# Patient Record
Sex: Female | Born: 1945 | Race: White | Hispanic: No | Marital: Married | Smoking: Former smoker
Health system: Southern US, Community
[De-identification: ages and names within clinical notes are randomized; demographics above are authoritative.]

## PROBLEM LIST (undated history)

## (undated) DIAGNOSIS — R739 Hyperglycemia, unspecified: Secondary | ICD-10-CM

## (undated) DIAGNOSIS — J40 Bronchitis, not specified as acute or chronic: Secondary | ICD-10-CM

## (undated) HISTORY — DX: Hyperglycemia, unspecified: R73.9

## (undated) HISTORY — DX: Bronchitis, not specified as acute or chronic: J40

---

## 1980-03-30 HISTORY — PX: ABDOMINAL HYSTERECTOMY: SHX81

## 1990-03-30 HISTORY — PX: APPENDECTOMY: SHX54

## 1990-03-30 HISTORY — PX: BILATERAL OOPHORECTOMY: SHX1221

## 2004-07-23 ENCOUNTER — Ambulatory Visit: Payer: Self-pay | Admitting: Internal Medicine

## 2005-12-25 ENCOUNTER — Ambulatory Visit: Payer: Self-pay | Admitting: General Surgery

## 2005-12-31 ENCOUNTER — Emergency Department: Payer: Self-pay | Admitting: Emergency Medicine

## 2005-12-31 ENCOUNTER — Other Ambulatory Visit: Payer: Self-pay

## 2006-01-01 ENCOUNTER — Ambulatory Visit: Payer: Self-pay | Admitting: General Surgery

## 2006-06-15 LAB — HM COLONOSCOPY: HM Colonoscopy: NORMAL

## 2008-02-15 ENCOUNTER — Ambulatory Visit: Payer: Self-pay | Admitting: Internal Medicine

## 2008-02-22 ENCOUNTER — Ambulatory Visit: Payer: Self-pay | Admitting: Internal Medicine

## 2009-03-30 HISTORY — PX: BREAST BIOPSY: SHX20

## 2009-04-23 ENCOUNTER — Ambulatory Visit: Payer: Self-pay | Admitting: Internal Medicine

## 2010-03-27 ENCOUNTER — Inpatient Hospital Stay (HOSPITAL_COMMUNITY)
Admission: RE | Admit: 2010-03-27 | Discharge: 2010-03-28 | Payer: Self-pay | Source: Home / Self Care | Attending: Neurosurgery | Admitting: Neurosurgery

## 2010-03-27 HISTORY — PX: LUMBAR DISC SURGERY: SHX700

## 2010-06-09 LAB — BASIC METABOLIC PANEL
BUN: 14 mg/dL (ref 6–23)
CO2: 25 mEq/L (ref 19–32)
Calcium: 9.8 mg/dL (ref 8.4–10.5)
Chloride: 104 mEq/L (ref 96–112)
Creatinine, Ser: 0.68 mg/dL (ref 0.4–1.2)
GFR calc Af Amer: >60 mL/min (ref >60)
GFR calc non Af Amer: >60 mL/min (ref >60)
Glucose, Bld: 118 mg/dL — ABNORMAL HIGH (ref 70–99)
Potassium: 4.6 mEq/L (ref 3.5–5.1)
Sodium: 137 mEq/L (ref 135–145)

## 2010-06-09 LAB — CBC
HCT: 45.4 % (ref 36.0–46.0)
Hemoglobin: 15.3 g/dL — ABNORMAL HIGH (ref 12.0–15.0)
MCH: 27.9 pg (ref 26.0–34.0)
MCHC: 33.7 g/dL (ref 30.0–36.0)
MCV: 82.7 fL (ref 78.0–100.0)
Platelets: 211 10*3/uL (ref 150–400)
RBC: 5.49 MIL/uL — ABNORMAL HIGH (ref 3.87–5.11)
RDW: 14.1 % (ref 11.5–15.5)
WBC: 11.7 10*3/uL — ABNORMAL HIGH (ref 4.0–10.5)

## 2010-06-09 LAB — ABO/RH: ABO/RH(D): O POS

## 2010-06-09 LAB — TYPE AND SCREEN
ABO/RH(D): O POS
Antibody Screen: NEGATIVE

## 2010-06-09 LAB — SURGICAL PCR SCREEN
MRSA, PCR: NEGATIVE
Staphylococcus aureus: NEGATIVE

## 2010-12-31 ENCOUNTER — Ambulatory Visit: Payer: Self-pay | Admitting: Unknown Physician Specialty

## 2011-04-27 ENCOUNTER — Ambulatory Visit: Payer: Self-pay | Admitting: Internal Medicine

## 2011-05-01 ENCOUNTER — Ambulatory Visit: Payer: Self-pay | Admitting: Internal Medicine

## 2011-05-11 ENCOUNTER — Ambulatory Visit: Payer: Self-pay | Admitting: Internal Medicine

## 2011-06-05 ENCOUNTER — Ambulatory Visit: Payer: Self-pay | Admitting: Internal Medicine

## 2011-06-15 ENCOUNTER — Encounter: Payer: Self-pay | Admitting: Internal Medicine

## 2011-06-15 ENCOUNTER — Ambulatory Visit (INDEPENDENT_AMBULATORY_CARE_PROVIDER_SITE_OTHER): Payer: Medicare Other | Admitting: Internal Medicine

## 2011-06-15 VITALS — BP 130/70 | HR 85 | Temp 98.2°F | Ht 61.5 in | Wt 135.0 lb

## 2011-06-15 DIAGNOSIS — R7303 Prediabetes: Secondary | ICD-10-CM | POA: Insufficient documentation

## 2011-06-15 DIAGNOSIS — F411 Generalized anxiety disorder: Secondary | ICD-10-CM

## 2011-06-15 DIAGNOSIS — Z1239 Encounter for other screening for malignant neoplasm of breast: Secondary | ICD-10-CM

## 2011-06-15 DIAGNOSIS — R7309 Other abnormal glucose: Secondary | ICD-10-CM

## 2011-06-15 DIAGNOSIS — J411 Mucopurulent chronic bronchitis: Secondary | ICD-10-CM

## 2011-06-15 DIAGNOSIS — F419 Anxiety disorder, unspecified: Secondary | ICD-10-CM | POA: Insufficient documentation

## 2011-06-15 NOTE — Assessment & Plan Note (Signed)
Currently asymptomatic. Will obtain records on previous evaluation and management. We'll also obtain records on recent chest x-ray. Followup in one month.

## 2011-06-15 NOTE — Assessment & Plan Note (Signed)
Encouraged patient to eat female with blood sugar results over the next few weeks. Will obtain records on previous evaluation and management. Followup in one month.

## 2011-06-15 NOTE — Assessment & Plan Note (Signed)
Symptoms currently well-controlled with Xanax. We'll plan to continue.

## 2011-06-15 NOTE — Progress Notes (Signed)
Subjective:    Patient ID: Regina Kennedy, female    DOB: 1945/11/12, 66 y.o.   MRN: [id number]  HPI 66 year old female with history of anxiety, elevated blood sugars/borderline diabetes, and recurrent bronchitis presents to establish care. In regards to her anxiety, she reports her symptoms are well controlled with the use of Xanax. She does not generally take 3 tablets daily, but mostly uses this medication at night to help with anxiety and insomnia.  In regards to her elevated blood sugars, she brings records from her previous physician which shows her last A1c was 6.5%. She was [facility] and has started nutritional counseling. She has made a conscious effort to improve her diet. She has been checking her blood sugar approximately once per day but did not bring record with her today. She is not taking medications to lower her blood sugar at this time.  In regards to her history of bronchitis, she notes that she is a former smoker and quit smoking in May of 2011. She has had a couple of episodes over the last 3 years of bronchitis which responded to use of prednisone and antibiotics. She does not use any maintenance medication or inhalers for chronic bronchitis or asthma. She denies any current symptoms of shortness of breath or cough.  Outpatient Encounter Prescriptions as of 06/15/2011  Medication Sig Dispense Refill  . ALPRAZolam (XANAX) 0.5 MG tablet Take 0.5 mg by mouth 3 (three) times daily as needed.      . Calcium Carb-Cholecalciferol (LIQUID CALCIUM WITH D3) [id number] [id number] MG-UNIT CAPS Take by mouth.      . cholecalciferol (VITAMIN D) 1000 UNITS tablet Take 1,000 Units by mouth daily.      . vitamin C (ASCORBIC ACID) 500 MG tablet Take 500 mg by mouth daily.        Review of Systems  Constitutional: Negative for fever, chills, appetite change, fatigue and unexpected weight change.  HENT: Negative for ear pain, congestion, sore throat, trouble swallowing, neck pain,  voice change and sinus pressure.   Eyes: Negative for visual disturbance.  Respiratory: Negative for cough, shortness of breath, wheezing and stridor.   Cardiovascular: Negative for chest pain, palpitations and leg swelling.  Gastrointestinal: Negative for nausea, vomiting, abdominal pain, diarrhea, constipation, blood in stool, abdominal distention and anal bleeding.  Genitourinary: Negative for dysuria and flank pain.  Musculoskeletal: Negative for myalgias, arthralgias and gait problem.  Skin: Negative for color change and rash.  Neurological: Negative for dizziness and headaches.  Hematological: Negative for adenopathy. Does not bruise/bleed easily.  Psychiatric/Behavioral: Negative for suicidal ideas, sleep disturbance and dysphoric mood. The patient is not nervous/anxious.    BP 130/70  Pulse 85  Temp(Src) 98.2 F (36.8 C) (Oral)  Ht 5' 1.5" (1.562 m)  Wt 135 lb (61.236 kg)  BMI 25.10 kg/m2  SpO2 97%     Objective:   Physical Exam  Constitutional: She is oriented to person, place, and time. She appears well-developed and well-nourished. No distress.  HENT:  Head: Normocephalic and atraumatic.  Right Ear: External ear normal.  Left Ear: External ear normal.  Nose: Nose normal.  Mouth/Throat: Oropharynx is clear and moist. No oropharyngeal exudate.  Eyes: Conjunctivae are normal. Pupils are equal, round, and reactive to light. Right eye exhibits no discharge. Left eye exhibits no discharge. No scleral icterus.  Neck: Normal range of motion. Neck supple. No tracheal deviation present. No thyromegaly present.  Cardiovascular: Normal rate, regular rhythm, normal heart sounds and intact  distal pulses.  Exam reveals no gallop and no friction rub.   No murmur heard. Pulmonary/Chest: Effort normal and breath sounds normal. No respiratory distress. She has no wheezes. She has no rales. She exhibits no tenderness.  Abdominal: Soft. Bowel sounds are normal. She exhibits no distension  and no mass. There is no tenderness. There is no rebound and no guarding.  Musculoskeletal: Normal range of motion. She exhibits no edema and no tenderness.  Lymphadenopathy:    She has no cervical adenopathy.  Neurological: She is alert and oriented to person, place, and time. No cranial nerve deficit. She exhibits normal muscle tone. Coordination normal.  Skin: Skin is warm and dry. No rash noted. She is not diaphoretic. No erythema. No pallor.  Psychiatric: She has a normal mood and affect. Her behavior is normal. Judgment and thought content normal.          Assessment & Plan:

## 2011-06-22 ENCOUNTER — Ambulatory Visit: Payer: Self-pay | Admitting: Internal Medicine

## 2011-06-29 ENCOUNTER — Ambulatory Visit: Payer: Self-pay | Admitting: Internal Medicine

## 2011-07-20 ENCOUNTER — Other Ambulatory Visit (HOSPITAL_COMMUNITY)
Admission: RE | Admit: 2011-07-20 | Discharge: 2011-07-20 | Disposition: A | Discharge status: Home / Self Care | Payer: Medicare Other | Source: Ambulatory Visit | Attending: Internal Medicine | Admitting: Internal Medicine

## 2011-07-20 ENCOUNTER — Ambulatory Visit (INDEPENDENT_AMBULATORY_CARE_PROVIDER_SITE_OTHER): Payer: Medicare Other | Admitting: Internal Medicine

## 2011-07-20 ENCOUNTER — Encounter: Payer: Self-pay | Admitting: Internal Medicine

## 2011-07-20 VITALS — BP 150/74 | HR 74 | Temp 97.8°F | Ht 62.0 in | Wt 132.0 lb

## 2011-07-20 DIAGNOSIS — R7303 Prediabetes: Secondary | ICD-10-CM

## 2011-07-20 DIAGNOSIS — E785 Hyperlipidemia, unspecified: Secondary | ICD-10-CM

## 2011-07-20 DIAGNOSIS — F419 Anxiety disorder, unspecified: Secondary | ICD-10-CM

## 2011-07-20 DIAGNOSIS — Z124 Encounter for screening for malignant neoplasm of cervix: Secondary | ICD-10-CM

## 2011-07-20 DIAGNOSIS — R7309 Other abnormal glucose: Secondary | ICD-10-CM

## 2011-07-20 DIAGNOSIS — Z1159 Encounter for screening for other viral diseases: Secondary | ICD-10-CM | POA: Insufficient documentation

## 2011-07-20 DIAGNOSIS — F411 Generalized anxiety disorder: Secondary | ICD-10-CM

## 2011-07-20 LAB — COMPREHENSIVE METABOLIC PANEL
AST: 19 U/L (ref 0–37)
Albumin: 4.6 g/dL (ref 3.5–5.2)
Alkaline Phosphatase: 93 U/L (ref 39–117)
BUN: 18 mg/dL (ref 6–23)
Creatinine, Ser: 0.8 mg/dL (ref 0.4–1.2)
Potassium: 4.3 mEq/L (ref 3.5–5.1)
Sodium: 138 mEq/L (ref 135–145)
Total Bilirubin: 0.6 mg/dL (ref 0.3–1.2)
Total Protein: 7.8 g/dL (ref 6.0–8.3)

## 2011-07-20 LAB — LIPID PANEL: Total CHOL/HDL Ratio: 10

## 2011-07-20 MED ORDER — ALPRAZOLAM 0.5 MG PO TABS: 0.5000 mg | ORAL_TABLET | Freq: Three times a day (TID) | ORAL | Status: DC | PRN

## 2011-07-20 MED ORDER — GLUCOSE BLOOD VI STRP: ORAL_STRIP | Status: AC

## 2011-07-20 NOTE — Assessment & Plan Note (Signed)
Exam is normal today including pelvic and breast exam. Pap is pending. Will request records on patient's vaccination record. Will check labs today including CMP and A1c.

## 2011-07-20 NOTE — Progress Notes (Signed)
Subjective:    Patient ID: Regina Kennedy, female    DOB: 05-26-1945, 66 y.o.   MRN: [id number]  HPI 66 year old female with history of borderline diabetes mellitus presents for her annual physical exam. She reports that she has been doing well. Review of recent blood sugars show motion occurs between 100-130. She has been following a healthy diet which is low in process sugar and saturated fat. She has lost another 4 pounds. She notes a mild increase in bloating with increase fiber in her diet. She has taken Beano for this with no improvement. She has also tried to increase her physical activity in an effort to control her blood sugar.  Outpatient Encounter Prescriptions as of 07/20/2011  Medication Sig Dispense Refill  . ALPRAZolam (XANAX) 0.5 MG tablet Take 1 tablet (0.5 mg total) by mouth 3 (three) times daily as needed.  90 tablet  3  . Calcium Carb-Cholecalciferol (LIQUID CALCIUM WITH D3) 5641430727 MG-UNIT CAPS Take by mouth.      . cholecalciferol (VITAMIN D) 1000 UNITS tablet Take 1,000 Units by mouth daily.      . vitamin C (ASCORBIC ACID) 500 MG tablet Take 500 mg by mouth daily.      Marland Kitchen DISCONTD: ALPRAZolam (XANAX) 0.5 MG tablet Take 0.5 mg by mouth 3 (three) times daily as needed.      Marland Kitchen glucose blood test strip Use as instructed  100 each  12    Review of Systems  Constitutional: Negative for fever, chills, appetite change, fatigue and unexpected weight change.  HENT: Negative for ear pain, congestion, sore throat, trouble swallowing, neck pain, voice change and sinus pressure.   Eyes: Negative for visual disturbance.  Respiratory: Negative for cough, shortness of breath, wheezing and stridor.   Cardiovascular: Negative for chest pain, palpitations and leg swelling.  Gastrointestinal: Positive for abdominal distention. Negative for nausea, vomiting, abdominal pain, diarrhea, constipation, blood in stool and anal bleeding.  Genitourinary: Negative for dysuria and flank pain.    Musculoskeletal: Negative for myalgias, arthralgias and gait problem.  Skin: Negative for color change and rash.  Neurological: Negative for dizziness and headaches.  Hematological: Negative for adenopathy. Does not bruise/bleed easily.  Psychiatric/Behavioral: Negative for suicidal ideas, sleep disturbance and dysphoric mood. The patient is not nervous/anxious.    BP 150/74  Pulse 74  Temp(Src) 97.8 F (36.6 C) (Oral)  Ht 5\' 2"  (1.575 m)  Wt 132 lb (59.875 kg)  BMI 24.14 kg/m2     Objective:   Physical Exam  Constitutional: She is oriented to person, place, and time. She appears well-developed and well-nourished. No distress.  HENT:  Head: Normocephalic and atraumatic.  Right Ear: External ear normal.  Left Ear: External ear normal.  Nose: Nose normal.  Mouth/Throat: Oropharynx is clear and moist. No oropharyngeal exudate.  Eyes: Conjunctivae are normal. Pupils are equal, round, and reactive to light. Right eye exhibits no discharge. Left eye exhibits no discharge. No scleral icterus.  Neck: Normal range of motion. Neck supple. No tracheal deviation present. No thyromegaly present.  Cardiovascular: Normal rate, regular rhythm, normal heart sounds and intact distal pulses.  Exam reveals no gallop and no friction rub.   No murmur heard. Pulmonary/Chest: Effort normal and breath sounds normal. No respiratory distress. She has no wheezes. She has no rales. She exhibits no tenderness.  Abdominal: Soft. Bowel sounds are normal. She exhibits no distension and no mass. There is no tenderness. There is no rebound and no guarding.  Genitourinary: Rectum normal,  vagina normal and uterus normal.    No breast swelling, tenderness, discharge or bleeding. Pelvic exam was performed with patient supine. There is no rash, tenderness or lesion on the right labia. There is no rash, tenderness or lesion on the left labia. Uterus is not enlarged and not tender. Cervix exhibits no motion tenderness, no  discharge and no friability. Right adnexum displays no mass, no tenderness and no fullness. Left adnexum displays no mass, no tenderness and no fullness. No erythema or tenderness around the vagina. No vaginal discharge found.  Musculoskeletal: Normal range of motion. She exhibits no edema and no tenderness.  Lymphadenopathy:    She has no cervical adenopathy.  Neurological: She is alert and oriented to person, place, and time. No cranial nerve deficit. She exhibits normal muscle tone. Coordination normal.  Skin: Skin is warm and dry. No rash noted. She is not diaphoretic. No erythema. No pallor.  Psychiatric: She has a normal mood and affect. Her behavior is normal. Judgment and thought content normal.          Assessment & Plan:

## 2011-07-20 NOTE — Assessment & Plan Note (Signed)
Will check A1c with labs today. Follow up 6 months.

## 2011-07-28 ENCOUNTER — Telehealth: Payer: Self-pay | Admitting: Internal Medicine

## 2011-07-28 NOTE — Telephone Encounter (Signed)
Caller: Regina Kennedy/Patient; PCP: Ronna Polio; CB#: [phone];  Call regarding Questions Related To Cholesterol Medications; Allergies; First she states her recent cholesterol was elevated and PCP wanted her to go on Crestor.  She has not tolerated similar medications previously.   Would like to know if medication is needed could she get Rx for Atorvastatin instead.  Her most recent cholesterol  test was not fasting; she had eaten egg and other foods for breakfast. Second issue:   Allergy sx worse on 07/27/11.  She has previously taken Allegra D; it kept her from resting well.  Advised home care and parameters for callback per Allergies protocol. Zyrtec and Claritin recommended.

## 2011-07-28 NOTE — Telephone Encounter (Signed)
If she is worried about accuracy of the cholesterol test because she had eaten, we can repeat fasting lipid profile. Then, based on results we can decide about medication.

## 2011-07-29 ENCOUNTER — Ambulatory Visit: Payer: Self-pay | Admitting: Internal Medicine

## 2011-07-30 ENCOUNTER — Telehealth: Payer: Self-pay | Admitting: Internal Medicine

## 2011-07-30 NOTE — Telephone Encounter (Signed)
Patient calling, states she was returning a call from a [employer].  States she thinks it was in regards to a question about her cholesterol medication.  Called office and they state that no one by that name works there.  Patient states she is not positive that it was a call from the office, but she thought it might be.  Please call patient back if you need to speak with her.  Thank you.

## 2011-07-30 NOTE — Telephone Encounter (Signed)
Left detailed message notifying patient. Asked that she call back to schedule lab appt if she would like to repeat labs.

## 2011-07-30 NOTE — Telephone Encounter (Signed)
I would recommend reading the book "Prevent and Reverse Heart Disease" by Dr. Suzzette Righter. This book has good information and recipes to help lower cholesterol.

## 2011-07-30 NOTE — Telephone Encounter (Signed)
Patient has Crestor samples but does not want to try any meds in the statin family because of past side effects and would like to know what else you could suggest for cholesterol. Please advise.

## 2011-07-30 NOTE — Telephone Encounter (Signed)
See attached message

## 2011-07-31 NOTE — Telephone Encounter (Signed)
Left message notifying patient.

## 2011-10-07 ENCOUNTER — Ambulatory Visit: Payer: Self-pay | Admitting: General Surgery

## 2011-11-06 ENCOUNTER — Telehealth: Payer: Self-pay | Admitting: Internal Medicine

## 2011-11-06 NOTE — Telephone Encounter (Signed)
Caller: Regina Kennedy/Patient; PCP: Ronna Polio; CB#: [phone]; ; ; Call regarding Sinus Drainage, Coughed Up Some Yellow Bloody Mucus; Onset 11/05/11 Patient states she is coughing up Mucus streaked with blood. patient states she is on Cefdinir for URI. Afebrile.  All emergent symptoms ruled out per Upper Respiratory Infection with exception "Productive cough with colored sputum."  Attempted to schedule appointment in [facility].  Appointment not available.  Message sent to the office.  Please follow up with patient for appointment needs.  Patient states she can be reached at home number or her cell phone [phone] and ok to leave a message.

## 2011-11-06 NOTE — Telephone Encounter (Signed)
Will need to go to urgent care or ED for evaluation. Should go today as she will need CXR given blood in sputum

## 2011-11-06 NOTE — Telephone Encounter (Signed)
Patient advised as instructed via telephone, she will go to Urgent Care.

## 2011-11-26 ENCOUNTER — Ambulatory Visit: Payer: Self-pay | Admitting: Unknown Physician Specialty

## 2011-11-27 ENCOUNTER — Ambulatory Visit: Payer: Self-pay | Admitting: Hematology and Oncology

## 2011-12-01 ENCOUNTER — Ambulatory Visit: Payer: Self-pay | Admitting: Hematology and Oncology

## 2011-12-01 LAB — COMPREHENSIVE METABOLIC PANEL
Albumin: 3.5 g/dL (ref 3.4–5.0)
Anion Gap: 9 (ref 7–16)
Calcium, Total: 9.7 mg/dL (ref 8.5–10.1)
Chloride: 101 mmol/L (ref 98–107)
EGFR (African American): >60
Glucose: 153 mg/dL — ABNORMAL HIGH (ref 65–99)
Osmolality: 276 (ref 275–301)
Potassium: 4.2 mmol/L (ref 3.5–5.1)
SGOT(AST): 13 U/L — ABNORMAL LOW (ref 15–37)
Sodium: 135 mmol/L — ABNORMAL LOW (ref 136–145)
Total Protein: 8 g/dL (ref 6.4–8.2)

## 2011-12-01 LAB — CBC CANCER CENTER
Basophil #: 0 x10 3/mm (ref 0.0–0.1)
Basophil %: 0.2 %
Eosinophil %: 0 %
HCT: 43.2 % (ref 35.0–47.0)
Lymphocyte #: 0.7 x10 3/mm — ABNORMAL LOW (ref 1.0–3.6)
MCH: 24.1 pg — ABNORMAL LOW (ref 26.0–34.0)
MCV: 78 fL — ABNORMAL LOW (ref 80–100)
Monocyte %: 3.1 %
Neutrophil #: 12.5 x10 3/mm — ABNORMAL HIGH (ref 1.4–6.5)
RBC: 5.58 10*6/uL — ABNORMAL HIGH (ref 3.80–5.20)
WBC: 13.7 x10 3/mm — ABNORMAL HIGH (ref 3.6–11.0)

## 2011-12-01 LAB — PROTIME-INR: INR: 1

## 2011-12-01 LAB — LACTATE DEHYDROGENASE: LDH: 283 U/L — ABNORMAL HIGH (ref 81–234)

## 2011-12-04 ENCOUNTER — Ambulatory Visit: Payer: Self-pay | Admitting: Unknown Physician Specialty

## 2011-12-07 ENCOUNTER — Ambulatory Visit: Payer: Self-pay | Admitting: Hematology and Oncology

## 2011-12-10 LAB — MAGNESIUM: Magnesium: 2.3 mg/dL

## 2011-12-17 LAB — CBC CANCER CENTER
Basophil #: 0 x10 3/mm (ref 0.0–0.1)
Eosinophil #: 0.1 x10 3/mm (ref 0.0–0.7)
Eosinophil %: 0.6 %
HCT: 37 % (ref 35.0–47.0)
Lymphocyte #: 0.9 x10 3/mm — ABNORMAL LOW (ref 1.0–3.6)
Lymphocyte %: 7.7 %
MCV: 77 fL — ABNORMAL LOW (ref 80–100)
Monocyte %: 9.7 %
Neutrophil #: 9.6 x10 3/mm — ABNORMAL HIGH (ref 1.4–6.5)
Platelet: 270 x10 3/mm (ref 150–440)
RBC: 4.79 10*6/uL (ref 3.80–5.20)
RDW: 18 % — ABNORMAL HIGH (ref 11.5–14.5)
WBC: 11.7 x10 3/mm — ABNORMAL HIGH (ref 3.6–11.0)

## 2011-12-17 LAB — COMPREHENSIVE METABOLIC PANEL
Calcium, Total: 9.2 mg/dL (ref 8.5–10.1)
Chloride: 93 mmol/L — ABNORMAL LOW (ref 98–107)
EGFR (Non-African Amer.): >60
Glucose: 189 mg/dL — ABNORMAL HIGH (ref 65–99)
Potassium: 4.5 mmol/L (ref 3.5–5.1)
SGOT(AST): 21 U/L (ref 15–37)
SGPT (ALT): 23 U/L (ref 12–78)

## 2011-12-17 LAB — PATHOLOGY REPORT

## 2011-12-24 LAB — CBC CANCER CENTER
Basophil #: 0 x10 3/mm (ref 0.0–0.1)
Eosinophil #: 0.1 x10 3/mm (ref 0.0–0.7)
HCT: 35.5 % (ref 35.0–47.0)
Lymphocyte %: 28.4 %
MCHC: 32.1 g/dL (ref 32.0–36.0)
Neutrophil #: 2.6 x10 3/mm (ref 1.4–6.5)
Neutrophil %: 64 %
Platelet: 275 x10 3/mm (ref 150–440)
RDW: 17.6 % — ABNORMAL HIGH (ref 11.5–14.5)

## 2011-12-28 LAB — URINALYSIS, COMPLETE
Leukocyte Esterase: NEGATIVE
Ph: 6 (ref 4.5–8.0)
Protein: NEGATIVE
RBC,UR: 1 /HPF (ref 0–5)

## 2011-12-29 ENCOUNTER — Ambulatory Visit: Payer: Self-pay | Admitting: Hematology and Oncology

## 2011-12-29 LAB — URINE CULTURE

## 2011-12-31 LAB — CBC CANCER CENTER
Basophil #: 0.1 x10 3/mm (ref 0.0–0.1)
HCT: 36.2 % (ref 35.0–47.0)
HGB: 11.5 g/dL — ABNORMAL LOW (ref 12.0–16.0)
Lymphocyte #: 1 x10 3/mm (ref 1.0–3.6)
MCH: 24.3 pg — ABNORMAL LOW (ref 26.0–34.0)
MCHC: 31.6 g/dL — ABNORMAL LOW (ref 32.0–36.0)
MCV: 77 fL — ABNORMAL LOW (ref 80–100)
Monocyte #: 1.5 x10 3/mm — ABNORMAL HIGH (ref 0.2–0.9)
Monocyte %: 41.8 %
Neutrophil #: 0.9 x10 3/mm — ABNORMAL LOW (ref 1.4–6.5)
RDW: 18.1 % — ABNORMAL HIGH (ref 11.5–14.5)

## 2012-01-05 ENCOUNTER — Ambulatory Visit: Payer: Self-pay | Admitting: General Surgery

## 2012-01-07 LAB — CBC CANCER CENTER
Basophil #: 0.1 x10 3/mm (ref 0.0–0.1)
Eosinophil #: 0.1 x10 3/mm (ref 0.0–0.7)
HGB: 10.6 g/dL — ABNORMAL LOW (ref 12.0–16.0)
Lymphocyte #: 1.4 x10 3/mm (ref 1.0–3.6)
MCH: 24 pg — ABNORMAL LOW (ref 26.0–34.0)
MCV: 76 fL — ABNORMAL LOW (ref 80–100)
Monocyte #: 1.1 x10 3/mm — ABNORMAL HIGH (ref 0.2–0.9)
Neutrophil %: 74.8 %
Platelet: 329 x10 3/mm (ref 150–440)
RDW: 18.1 % — ABNORMAL HIGH (ref 11.5–14.5)
WBC: 11 x10 3/mm (ref 3.6–11.0)

## 2012-01-07 LAB — COMPREHENSIVE METABOLIC PANEL
Albumin: 2.6 g/dL — ABNORMAL LOW (ref 3.4–5.0)
Anion Gap: 9 (ref 7–16)
Calcium, Total: 9.4 mg/dL (ref 8.5–10.1)
EGFR (African American): >60
Glucose: 184 mg/dL — ABNORMAL HIGH (ref 65–99)
Potassium: 4.5 mmol/L (ref 3.5–5.1)
SGOT(AST): 14 U/L — ABNORMAL LOW (ref 15–37)

## 2012-01-14 LAB — CBC CANCER CENTER
Basophil #: 0.1 x10 3/mm (ref 0.0–0.1)
Basophil %: 0.9 %
HCT: 35.1 % (ref 35.0–47.0)
HGB: 10.8 g/dL — ABNORMAL LOW (ref 12.0–16.0)
Lymphocyte %: 16.5 %
Monocyte %: 3.8 %
Neutrophil #: 6.6 x10 3/mm — ABNORMAL HIGH (ref 1.4–6.5)
Neutrophil %: 76.8 %
WBC: 8.6 x10 3/mm (ref 3.6–11.0)

## 2012-01-18 ENCOUNTER — Other Ambulatory Visit: Payer: Self-pay | Admitting: Internal Medicine

## 2012-01-19 ENCOUNTER — Ambulatory Visit: Payer: Medicare Other | Admitting: Internal Medicine

## 2012-01-19 NOTE — Telephone Encounter (Signed)
Rx called to [facility] pharmacy.

## 2012-01-21 LAB — CBC CANCER CENTER
Basophil #: 0.1 x10 3/mm (ref 0.0–0.1)
Basophil %: 1.9 %
HCT: 33.6 % — ABNORMAL LOW (ref 35.0–47.0)
Lymphocyte #: 1.1 x10 3/mm (ref 1.0–3.6)
MCH: 24 pg — ABNORMAL LOW (ref 26.0–34.0)
MCHC: 31.4 g/dL — ABNORMAL LOW (ref 32.0–36.0)
MCV: 77 fL — ABNORMAL LOW (ref 80–100)
Monocyte %: 25.5 %
Neutrophil #: 2.4 x10 3/mm (ref 1.4–6.5)
RDW: 18.8 % — ABNORMAL HIGH (ref 11.5–14.5)

## 2012-01-28 LAB — COMPREHENSIVE METABOLIC PANEL
Albumin: 2.8 g/dL — ABNORMAL LOW (ref 3.4–5.0)
Alkaline Phosphatase: 154 U/L — ABNORMAL HIGH (ref 50–136)
Bilirubin,Total: 0.4 mg/dL (ref 0.2–1.0)
Creatinine: 0.81 mg/dL (ref 0.60–1.30)
EGFR (African American): >60
Osmolality: 277 (ref 275–301)
Potassium: 4.3 mmol/L (ref 3.5–5.1)
SGOT(AST): 12 U/L — ABNORMAL LOW (ref 15–37)
Sodium: 134 mmol/L — ABNORMAL LOW (ref 136–145)

## 2012-01-28 LAB — CBC CANCER CENTER
Basophil %: 1.1 %
Eosinophil #: 0.1 x10 3/mm (ref 0.0–0.7)
Eosinophil %: 1.3 %
HCT: 33.1 % — ABNORMAL LOW (ref 35.0–47.0)
HGB: 10.2 g/dL — ABNORMAL LOW (ref 12.0–16.0)
Lymphocyte #: 1.2 x10 3/mm (ref 1.0–3.6)
MCH: 23.8 pg — ABNORMAL LOW (ref 26.0–34.0)
MCV: 77 fL — ABNORMAL LOW (ref 80–100)
Monocyte #: 1.4 x10 3/mm — ABNORMAL HIGH (ref 0.2–0.9)
Neutrophil #: 7.9 x10 3/mm — ABNORMAL HIGH (ref 1.4–6.5)
RBC: 4.29 10*6/uL (ref 3.80–5.20)
RDW: 18.9 % — ABNORMAL HIGH (ref 11.5–14.5)

## 2012-01-28 LAB — MAGNESIUM: Magnesium: 2 mg/dL

## 2012-01-29 ENCOUNTER — Ambulatory Visit: Payer: Self-pay | Admitting: Hematology and Oncology

## 2012-02-04 LAB — CBC CANCER CENTER
Basophil #: 0 x10 3/mm (ref 0.0–0.1)
Basophil %: 0.8 %
Eosinophil #: 0.2 x10 3/mm (ref 0.0–0.7)
Eosinophil %: 2.7 %
HGB: 10.4 g/dL — ABNORMAL LOW (ref 12.0–16.0)
Lymphocyte #: 1.2 x10 3/mm (ref 1.0–3.6)
MCH: 23.9 pg — ABNORMAL LOW (ref 26.0–34.0)
MCV: 76 fL — ABNORMAL LOW (ref 80–100)
Monocyte #: 0.2 x10 3/mm (ref 0.2–0.9)
Neutrophil #: 4.5 x10 3/mm (ref 1.4–6.5)
Platelet: 239 x10 3/mm (ref 150–440)
RBC: 4.35 10*6/uL (ref 3.80–5.20)
RDW: 18.9 % — ABNORMAL HIGH (ref 11.5–14.5)

## 2012-02-11 LAB — CBC CANCER CENTER
Basophil #: 0 x10 3/mm (ref 0.0–0.1)
Basophil %: 1.5 %
HCT: 31.4 % — ABNORMAL LOW (ref 35.0–47.0)
HGB: 9.8 g/dL — ABNORMAL LOW (ref 12.0–16.0)
Lymphocyte %: 28.3 %
Monocyte #: 1 x10 3/mm — ABNORMAL HIGH (ref 0.2–0.9)
Monocyte %: 32.2 %
Neutrophil #: 1.1 x10 3/mm — ABNORMAL LOW (ref 1.4–6.5)
Neutrophil %: 36.1 %
WBC: 3.1 x10 3/mm — ABNORMAL LOW (ref 3.6–11.0)

## 2012-02-18 LAB — CBC CANCER CENTER
Basophil %: 0.7 %
Eosinophil #: 0.1 x10 3/mm (ref 0.0–0.7)
Eosinophil %: 0.7 %
HCT: 30.8 % — ABNORMAL LOW (ref 35.0–47.0)
HGB: 9.6 g/dL — ABNORMAL LOW (ref 12.0–16.0)
Lymphocyte #: 0.9 x10 3/mm — ABNORMAL LOW (ref 1.0–3.6)
MCH: 23.8 pg — ABNORMAL LOW (ref 26.0–34.0)
MCV: 77 fL — ABNORMAL LOW (ref 80–100)
Monocyte #: 1.3 x10 3/mm — ABNORMAL HIGH (ref 0.2–0.9)
Monocyte %: 14 %
Neutrophil #: 7.1 x10 3/mm — ABNORMAL HIGH (ref 1.4–6.5)
Platelet: 296 x10 3/mm (ref 150–440)
RBC: 4.01 10*6/uL (ref 3.80–5.20)
RDW: 20 % — ABNORMAL HIGH (ref 11.5–14.5)
WBC: 9.4 x10 3/mm (ref 3.6–11.0)

## 2012-02-18 LAB — COMPREHENSIVE METABOLIC PANEL
Alkaline Phosphatase: 144 U/L — ABNORMAL HIGH (ref 50–136)
BUN: 10 mg/dL (ref 7–18)
Bilirubin,Total: 0.4 mg/dL (ref 0.2–1.0)
Chloride: 94 mmol/L — ABNORMAL LOW (ref 98–107)
Co2: 29 mmol/L (ref 21–32)
Creatinine: 0.76 mg/dL (ref 0.60–1.30)
EGFR (African American): >60
Potassium: 4.2 mmol/L (ref 3.5–5.1)
SGPT (ALT): 17 U/L (ref 12–78)
Total Protein: 7 g/dL (ref 6.4–8.2)

## 2012-02-24 LAB — CBC CANCER CENTER
Basophil %: 0.6 %
Eosinophil #: 0.1 x10 3/mm (ref 0.0–0.7)
Eosinophil %: 1.4 %
HCT: 32.9 % — ABNORMAL LOW (ref 35.0–47.0)
HGB: 10.4 g/dL — ABNORMAL LOW (ref 12.0–16.0)
MCH: 23.8 pg — ABNORMAL LOW (ref 26.0–34.0)
MCHC: 31.6 g/dL — ABNORMAL LOW (ref 32.0–36.0)
MCV: 75 fL — ABNORMAL LOW (ref 80–100)
Monocyte #: 0.1 x10 3/mm — ABNORMAL LOW (ref 0.2–0.9)
Neutrophil #: 5.5 x10 3/mm (ref 1.4–6.5)
Neutrophil %: 81 %
RBC: 4.37 10*6/uL (ref 3.80–5.20)
WBC: 6.8 x10 3/mm (ref 3.6–11.0)

## 2012-02-28 ENCOUNTER — Ambulatory Visit: Payer: Self-pay | Admitting: Hematology and Oncology

## 2012-03-03 LAB — COMPREHENSIVE METABOLIC PANEL
Bilirubin,Total: 0.5 mg/dL (ref 0.2–1.0)
Chloride: 97 mmol/L — ABNORMAL LOW (ref 98–107)
Co2: 29 mmol/L (ref 21–32)
Creatinine: 0.9 mg/dL (ref 0.60–1.30)
EGFR (African American): >60
Osmolality: 276 (ref 275–301)
Potassium: 4.2 mmol/L (ref 3.5–5.1)
SGPT (ALT): 20 U/L (ref 12–78)
Sodium: 136 mmol/L (ref 136–145)
Total Protein: 7 g/dL (ref 6.4–8.2)

## 2012-03-03 LAB — LACTATE DEHYDROGENASE: LDH: 376 U/L — ABNORMAL HIGH (ref 81–246)

## 2012-03-03 LAB — CBC CANCER CENTER
Basophil %: 2.4 %
Eosinophil #: 0 x10 3/mm (ref 0.0–0.7)
Eosinophil %: 2 %
HGB: 10.7 g/dL — ABNORMAL LOW (ref 12.0–16.0)
Lymphocyte #: 0.7 x10 3/mm — ABNORMAL LOW (ref 1.0–3.6)
Lymphocyte %: 26.2 %
MCHC: 32 g/dL (ref 32.0–36.0)
MCV: 76 fL — ABNORMAL LOW (ref 80–100)
Monocyte %: 34.2 %
Neutrophil #: 0.9 x10 3/mm — ABNORMAL LOW (ref 1.4–6.5)
RBC: 4.39 10*6/uL (ref 3.80–5.20)
RDW: 20.7 % — ABNORMAL HIGH (ref 11.5–14.5)
WBC: 2.5 x10 3/mm — ABNORMAL LOW (ref 3.6–11.0)

## 2012-03-08 LAB — CBC CANCER CENTER
Eosinophil #: 0 x10 3/mm (ref 0.0–0.7)
Eosinophil %: 0.4 %
HCT: 30.5 % — ABNORMAL LOW (ref 35.0–47.0)
Lymphocyte #: 1 x10 3/mm (ref 1.0–3.6)
Lymphocyte %: 10.6 %
MCH: 24.2 pg — ABNORMAL LOW (ref 26.0–34.0)
MCHC: 32 g/dL (ref 32.0–36.0)
MCV: 76 fL — ABNORMAL LOW (ref 80–100)
Monocyte #: 1.5 x10 3/mm — ABNORMAL HIGH (ref 0.2–0.9)
Monocyte %: 15.5 %
Neutrophil #: 7 x10 3/mm — ABNORMAL HIGH (ref 1.4–6.5)
Neutrophil %: 73 %
Platelet: 291 x10 3/mm (ref 150–440)
RDW: 20.9 % — ABNORMAL HIGH (ref 11.5–14.5)

## 2012-03-08 LAB — COMPREHENSIVE METABOLIC PANEL
Albumin: 2.8 g/dL — ABNORMAL LOW (ref 3.4–5.0)
BUN: 9 mg/dL (ref 7–18)
Bilirubin,Total: 0.5 mg/dL (ref 0.2–1.0)
Calcium, Total: 9.3 mg/dL (ref 8.5–10.1)
Chloride: 97 mmol/L — ABNORMAL LOW (ref 98–107)
Creatinine: 0.74 mg/dL (ref 0.60–1.30)
EGFR (African American): >60
Glucose: 165 mg/dL — ABNORMAL HIGH (ref 65–99)
SGOT(AST): 14 U/L — ABNORMAL LOW (ref 15–37)
SGPT (ALT): 17 U/L (ref 12–78)
Sodium: 135 mmol/L — ABNORMAL LOW (ref 136–145)
Total Protein: 7.2 g/dL (ref 6.4–8.2)

## 2012-03-30 ENCOUNTER — Ambulatory Visit: Payer: Self-pay | Admitting: Hematology and Oncology

## 2012-04-06 LAB — CBC CANCER CENTER
Lymphocyte #: 0.7 x10 3/mm — ABNORMAL LOW (ref 1.0–3.6)
Lymphocyte %: 5.4 %
MCH: 23.8 pg — ABNORMAL LOW (ref 26.0–34.0)
MCHC: 31.4 g/dL — ABNORMAL LOW (ref 32.0–36.0)
Neutrophil #: 10.9 x10 3/mm — ABNORMAL HIGH (ref 1.4–6.5)
Neutrophil %: 81.4 %
WBC: 13.3 x10 3/mm — ABNORMAL HIGH (ref 3.6–11.0)

## 2012-04-06 LAB — COMPREHENSIVE METABOLIC PANEL
Albumin: 2.6 g/dL — ABNORMAL LOW (ref 3.4–5.0)
Anion Gap: 11 (ref 7–16)
BUN: 13 mg/dL (ref 7–18)
Calcium, Total: 9 mg/dL (ref 8.5–10.1)
Chloride: 94 mmol/L — ABNORMAL LOW (ref 98–107)
EGFR (Non-African Amer.): >60
Total Protein: 7.2 g/dL (ref 6.4–8.2)

## 2012-04-19 LAB — CBC CANCER CENTER
Basophil #: 0 x10 3/mm (ref 0.0–0.1)
Eosinophil #: 0 x10 3/mm (ref 0.0–0.7)
Eosinophil %: 1.3 %
HCT: 28.6 % — ABNORMAL LOW (ref 35.0–47.0)
HGB: 9 g/dL — ABNORMAL LOW (ref 12.0–16.0)
Lymphocyte %: 26.2 %
MCH: 24 pg — ABNORMAL LOW (ref 26.0–34.0)
MCHC: 31.6 g/dL — ABNORMAL LOW (ref 32.0–36.0)
MCV: 76 fL — ABNORMAL LOW (ref 80–100)
Monocyte #: 0.4 x10 3/mm (ref 0.2–0.9)
Monocyte %: 16.2 %
Neutrophil #: 1.5 x10 3/mm (ref 1.4–6.5)
Neutrophil %: 55.1 %
Platelet: 238 x10 3/mm (ref 150–440)
RBC: 3.75 10*6/uL — ABNORMAL LOW (ref 3.80–5.20)
RDW: 22.7 % — ABNORMAL HIGH (ref 11.5–14.5)
WBC: 2.6 x10 3/mm — ABNORMAL LOW (ref 3.6–11.0)

## 2012-04-20 LAB — CBC CANCER CENTER
Basophil %: 1.4 %
Eosinophil %: 1.1 %
HGB: 9.1 g/dL — ABNORMAL LOW (ref 12.0–16.0)
Lymphocyte %: 21.6 %
MCH: 24.2 pg — ABNORMAL LOW (ref 26.0–34.0)
MCHC: 31.6 g/dL — ABNORMAL LOW (ref 32.0–36.0)
MCV: 77 fL — ABNORMAL LOW (ref 80–100)
Monocyte %: 17.2 %
Neutrophil #: 1.2 x10 3/mm — ABNORMAL LOW (ref 1.4–6.5)
Neutrophil %: 58.7 %
Platelet: 260 x10 3/mm (ref 150–440)
RBC: 3.77 10*6/uL — ABNORMAL LOW (ref 3.80–5.20)
RDW: 23 % — ABNORMAL HIGH (ref 11.5–14.5)
WBC: 2 x10 3/mm — CL (ref 3.6–11.0)

## 2012-04-27 LAB — CBC CANCER CENTER
Basophil #: 0.1 x10 3/mm (ref 0.0–0.1)
Basophil %: 1 %
Eosinophil %: 0.6 %
HCT: 29 % — ABNORMAL LOW (ref 35.0–47.0)
Lymphocyte #: 0.7 x10 3/mm — ABNORMAL LOW (ref 1.0–3.6)
Lymphocyte %: 13.3 %
MCH: 24 pg — ABNORMAL LOW (ref 26.0–34.0)
MCHC: 31.2 g/dL — ABNORMAL LOW (ref 32.0–36.0)
MCV: 77 fL — ABNORMAL LOW (ref 80–100)
Monocyte #: 1.7 x10 3/mm — ABNORMAL HIGH (ref 0.2–0.9)
Monocyte %: 30.6 %
Platelet: 346 x10 3/mm (ref 150–440)
RBC: 3.77 10*6/uL — ABNORMAL LOW (ref 3.80–5.20)
RDW: 22.7 % — ABNORMAL HIGH (ref 11.5–14.5)
WBC: 5.4 x10 3/mm (ref 3.6–11.0)

## 2012-04-30 ENCOUNTER — Ambulatory Visit: Payer: Self-pay | Admitting: Hematology and Oncology

## 2012-05-03 ENCOUNTER — Ambulatory Visit: Payer: Medicare Other | Admitting: Internal Medicine

## 2012-05-04 ENCOUNTER — Ambulatory Visit: Payer: Self-pay | Admitting: Hematology and Oncology

## 2012-05-04 LAB — COMPREHENSIVE METABOLIC PANEL
Albumin: 2.9 g/dL — ABNORMAL LOW (ref 3.4–5.0)
Alkaline Phosphatase: 171 U/L — ABNORMAL HIGH (ref 50–136)
Bilirubin,Total: 0.3 mg/dL (ref 0.2–1.0)
Calcium, Total: 9 mg/dL (ref 8.5–10.1)
Chloride: 98 mmol/L (ref 98–107)
Co2: 26 mmol/L (ref 21–32)
Creatinine: 0.68 mg/dL (ref 0.60–1.30)
EGFR (African American): >60
Glucose: 118 mg/dL — ABNORMAL HIGH (ref 65–99)
Osmolality: 267 (ref 275–301)
Potassium: 4.3 mmol/L (ref 3.5–5.1)
SGOT(AST): 20 U/L (ref 15–37)
Total Protein: 7.2 g/dL (ref 6.4–8.2)

## 2012-05-04 LAB — CBC CANCER CENTER
Basophil #: 0.1 x10 3/mm (ref 0.0–0.1)
Basophil %: 0.6 %
Eosinophil #: 0 x10 3/mm (ref 0.0–0.7)
HCT: 29.6 % — ABNORMAL LOW (ref 35.0–47.0)
Lymphocyte #: 0.6 x10 3/mm — ABNORMAL LOW (ref 1.0–3.6)
MCH: 24 pg — ABNORMAL LOW (ref 26.0–34.0)
MCHC: 31.3 g/dL — ABNORMAL LOW (ref 32.0–36.0)
MCV: 77 fL — ABNORMAL LOW (ref 80–100)
Platelet: 290 x10 3/mm (ref 150–440)
RBC: 3.86 10*6/uL (ref 3.80–5.20)
RDW: 23.7 % — ABNORMAL HIGH (ref 11.5–14.5)

## 2012-05-14 ENCOUNTER — Other Ambulatory Visit: Payer: Self-pay

## 2012-05-22 LAB — COMPREHENSIVE METABOLIC PANEL
Anion Gap: 9 (ref 7–16)
BUN: 18 mg/dL (ref 7–18)
Bilirubin,Total: 0.4 mg/dL (ref 0.2–1.0)
Co2: 25 mmol/L (ref 21–32)
Creatinine: 0.81 mg/dL (ref 0.60–1.30)
EGFR (African American): >60
EGFR (Non-African Amer.): >60
Glucose: 117 mg/dL — ABNORMAL HIGH (ref 65–99)
Osmolality: 267 (ref 275–301)
SGPT (ALT): 11 U/L — ABNORMAL LOW (ref 12–78)
Sodium: 132 mmol/L — ABNORMAL LOW (ref 136–145)

## 2012-05-22 LAB — CBC WITH DIFFERENTIAL/PLATELET
Comment - H1-Com3: NORMAL
Eosinophil %: 0.2 %
HCT: 25.9 % — ABNORMAL LOW (ref 35.0–47.0)
Lymphocyte #: 0.6 10*3/uL — ABNORMAL LOW (ref 1.0–3.6)
Lymphocyte %: 4.4 %
MCH: 23.9 pg — ABNORMAL LOW (ref 26.0–34.0)
Monocyte #: 1.1 x10 3/mm — ABNORMAL HIGH (ref 0.2–0.9)
Monocyte %: 8.1 %
Monocytes: 5 %
Neutrophil %: 87 %
Platelet: 264 10*3/uL (ref 150–440)
RDW: 24 % — ABNORMAL HIGH (ref 11.5–14.5)

## 2012-05-22 LAB — CK TOTAL AND CKMB (NOT AT ARMC)
CK, Total: 25 U/L (ref 21–215)
CK-MB: <0.5 ng/mL — ABNORMAL LOW (ref 0.5–3.6)

## 2012-05-23 ENCOUNTER — Inpatient Hospital Stay: Payer: Self-pay | Admitting: Internal Medicine

## 2012-05-23 LAB — URINALYSIS, COMPLETE
Bacteria: NONE SEEN
Bilirubin,UR: NEGATIVE
Blood: NEGATIVE
Glucose,UR: NEGATIVE mg/dL (ref 0–75)
Nitrite: NEGATIVE
Ph: 5 (ref 4.5–8.0)
Protein: NEGATIVE
RBC,UR: 1 /HPF (ref 0–5)
Specific Gravity: 1.047 (ref 1.003–1.030)
Squamous Epithelial: 2

## 2012-05-24 LAB — BASIC METABOLIC PANEL
Anion Gap: 6 — ABNORMAL LOW (ref 7–16)
BUN: 9 mg/dL (ref 7–18)
Calcium, Total: 7.2 mg/dL — ABNORMAL LOW (ref 8.5–10.1)
Chloride: 106 mmol/L (ref 98–107)
Co2: 24 mmol/L (ref 21–32)
EGFR (African American): >60
Glucose: 85 mg/dL (ref 65–99)
Osmolality: 270 (ref 275–301)
Potassium: 3.9 mmol/L (ref 3.5–5.1)
Sodium: 136 mmol/L (ref 136–145)

## 2012-05-24 LAB — CBC WITH DIFFERENTIAL/PLATELET
Basophil %: 0.5 %
Eosinophil #: 0 10*3/uL (ref 0.0–0.7)
HCT: 21.8 % — ABNORMAL LOW (ref 35.0–47.0)
Lymphocyte #: 0.3 10*3/uL — ABNORMAL LOW (ref 1.0–3.6)
Lymphocyte %: 3.3 %
MCH: 25.3 pg — ABNORMAL LOW (ref 26.0–34.0)
MCHC: 32 g/dL (ref 32.0–36.0)
MCV: 79 fL — ABNORMAL LOW (ref 80–100)
Monocyte %: 11 %
Neutrophil %: 85.1 %
WBC: 10.4 10*3/uL (ref 3.6–11.0)

## 2012-05-24 LAB — URINE CULTURE

## 2012-05-28 ENCOUNTER — Ambulatory Visit: Payer: Self-pay | Admitting: Hematology and Oncology

## 2012-05-28 LAB — CULTURE, BLOOD (SINGLE)

## 2012-06-01 LAB — COMPREHENSIVE METABOLIC PANEL
Albumin: 2.4 g/dL — ABNORMAL LOW (ref 3.4–5.0)
Bilirubin,Total: 0.3 mg/dL (ref 0.2–1.0)
Chloride: 96 mmol/L — ABNORMAL LOW (ref 98–107)
Co2: 31 mmol/L (ref 21–32)
EGFR (African American): >60
EGFR (Non-African Amer.): >60
Osmolality: 272 (ref 275–301)
Potassium: 4 mmol/L (ref 3.5–5.1)
SGOT(AST): 16 U/L (ref 15–37)
SGPT (ALT): 11 U/L — ABNORMAL LOW (ref 12–78)
Sodium: 134 mmol/L — ABNORMAL LOW (ref 136–145)
Total Protein: 6.4 g/dL (ref 6.4–8.2)

## 2012-06-01 LAB — CBC CANCER CENTER
Basophil %: 0.5 %
Eosinophil %: 0.2 %
Lymphocyte #: 0.3 x10 3/mm — ABNORMAL LOW (ref 1.0–3.6)
Lymphocyte %: 2.7 %
MCH: 24.4 pg — ABNORMAL LOW (ref 26.0–34.0)
MCHC: 31.1 g/dL — ABNORMAL LOW (ref 32.0–36.0)
Neutrophil #: 10.7 x10 3/mm — ABNORMAL HIGH (ref 1.4–6.5)
Neutrophil %: 83.3 %
Platelet: 291 x10 3/mm (ref 150–440)
RBC: 3.12 10*6/uL — ABNORMAL LOW (ref 3.80–5.20)
RDW: 24.2 % — ABNORMAL HIGH (ref 11.5–14.5)
WBC: 12.8 x10 3/mm — ABNORMAL HIGH (ref 3.6–11.0)

## 2012-06-01 LAB — MAGNESIUM: Magnesium: 1.8 mg/dL

## 2012-06-01 LAB — LACTATE DEHYDROGENASE: LDH: 338 U/L — ABNORMAL HIGH (ref 81–246)

## 2012-06-08 LAB — CBC CANCER CENTER
Basophil #: 0.1 x10 3/mm (ref 0.0–0.1)
Basophil %: 0.7 %
Eosinophil #: 0.2 x10 3/mm (ref 0.0–0.7)
Lymphocyte #: 0.5 x10 3/mm — ABNORMAL LOW (ref 1.0–3.6)
Lymphocyte %: 3.3 %
MCHC: 31.3 g/dL — ABNORMAL LOW (ref 32.0–36.0)
MCV: 83 fL (ref 80–100)
Monocyte #: 1.1 x10 3/mm — ABNORMAL HIGH (ref 0.2–0.9)
Monocyte %: 7.8 %
Neutrophil #: 12.4 x10 3/mm — ABNORMAL HIGH (ref 1.4–6.5)
Neutrophil %: 87.1 %
RDW: 22.5 % — ABNORMAL HIGH (ref 11.5–14.5)
WBC: 14.3 x10 3/mm — ABNORMAL HIGH (ref 3.6–11.0)

## 2012-06-08 LAB — COMPREHENSIVE METABOLIC PANEL
Albumin: 2.9 g/dL — ABNORMAL LOW (ref 3.4–5.0)
Anion Gap: 8 (ref 7–16)
BUN: 16 mg/dL (ref 7–18)
Bilirubin,Total: 0.6 mg/dL (ref 0.2–1.0)
Chloride: 99 mmol/L (ref 98–107)
Co2: 31 mmol/L (ref 21–32)
Creatinine: 0.99 mg/dL (ref 0.60–1.30)
EGFR (Non-African Amer.): 59 — ABNORMAL LOW
Glucose: 178 mg/dL — ABNORMAL HIGH (ref 65–99)
Osmolality: 281 (ref 275–301)
Potassium: 3.9 mmol/L (ref 3.5–5.1)
SGPT (ALT): 12 U/L (ref 12–78)
Total Protein: 6.9 g/dL (ref 6.4–8.2)

## 2012-06-09 ENCOUNTER — Telehealth: Payer: Self-pay | Admitting: Internal Medicine

## 2012-06-09 NOTE — Telephone Encounter (Signed)
Informing us that that they called to schedule PT eval for today and pt stated she was going to be out and wanted to wait until next Tuesday.

## 2012-06-09 NOTE — Telephone Encounter (Signed)
FYI, please read below.

## 2012-06-15 LAB — CBC CANCER CENTER
Basophil %: 0.9 %
Eosinophil %: 0.9 %
HGB: 9.8 g/dL — ABNORMAL LOW (ref 12.0–16.0)
Lymphocyte %: 13.7 %
MCV: 82 fL (ref 80–100)
Monocyte #: 0.4 x10 3/mm (ref 0.2–0.9)
Monocyte %: 11.2 %
Neutrophil %: 73.3 %
Platelet: 204 x10 3/mm (ref 150–440)

## 2012-06-22 LAB — CBC CANCER CENTER
Basophil #: 0.1 x10 3/mm (ref 0.0–0.1)
Basophil %: 1.3 %
Eosinophil #: 0 x10 3/mm (ref 0.0–0.7)
Eosinophil %: 0.3 %
HCT: 29.2 % — ABNORMAL LOW (ref 35.0–47.0)
HGB: 9.5 g/dL — ABNORMAL LOW (ref 12.0–16.0)
Lymphocyte #: 0.5 x10 3/mm — ABNORMAL LOW (ref 1.0–3.6)
Lymphocyte %: 12.6 %
MCH: 26.5 pg (ref 26.0–34.0)
MCV: 81 fL (ref 80–100)
Monocyte #: 0.8 x10 3/mm (ref 0.2–0.9)
Monocyte %: 20.9 %
Neutrophil #: 2.6 x10 3/mm (ref 1.4–6.5)
Neutrophil %: 64.9 %
Platelet: 224 x10 3/mm (ref 150–440)
RBC: 3.6 10*6/uL — ABNORMAL LOW (ref 3.80–5.20)
RDW: 20 % — ABNORMAL HIGH (ref 11.5–14.5)

## 2012-06-24 ENCOUNTER — Telehealth: Payer: Self-pay | Admitting: *Deleted

## 2012-06-24 NOTE — Telephone Encounter (Signed)
Irving Burton from physical therapy left a message stating patient cancelled her last 2 appointments. She cancelled Tuesday because she was not feeling up to it and cancelled it today because she was having company come over.

## 2012-06-27 NOTE — Telephone Encounter (Signed)
Please review other encounter in reference to this.

## 2012-06-28 ENCOUNTER — Ambulatory Visit: Payer: Self-pay | Admitting: Hematology and Oncology

## 2012-06-29 LAB — CBC CANCER CENTER
Basophil #: 0.1 x10 3/mm (ref 0.0–0.1)
Basophil %: 0.8 %
Eosinophil #: 0 x10 3/mm (ref 0.0–0.7)
Eosinophil %: 0.4 %
HCT: 29.5 % — ABNORMAL LOW (ref 35.0–47.0)
HGB: 9.3 g/dL — ABNORMAL LOW (ref 12.0–16.0)
Lymphocyte #: 0.8 x10 3/mm — ABNORMAL LOW (ref 1.0–3.6)
Lymphocyte %: 7.6 %
MCHC: 31.6 g/dL — ABNORMAL LOW (ref 32.0–36.0)
Monocyte #: 1.1 x10 3/mm — ABNORMAL HIGH (ref 0.2–0.9)
Monocyte %: 10.7 %
Neutrophil #: 8.4 x10 3/mm — ABNORMAL HIGH (ref 1.4–6.5)
Neutrophil %: 80.5 %
RBC: 3.65 10*6/uL — ABNORMAL LOW (ref 3.80–5.20)
RDW: 20 % — ABNORMAL HIGH (ref 11.5–14.5)
WBC: 10.5 x10 3/mm (ref 3.6–11.0)

## 2012-06-29 LAB — COMPREHENSIVE METABOLIC PANEL
Alkaline Phosphatase: 122 U/L (ref 50–136)
BUN: 13 mg/dL (ref 7–18)
Bilirubin,Total: 0.2 mg/dL (ref 0.2–1.0)
Calcium, Total: 8.2 mg/dL — ABNORMAL LOW (ref 8.5–10.1)
Chloride: 97 mmol/L — ABNORMAL LOW (ref 98–107)
Co2: 25 mmol/L (ref 21–32)
Creatinine: 0.92 mg/dL (ref 0.60–1.30)
EGFR (African American): >60
EGFR (Non-African Amer.): >60
Glucose: 112 mg/dL — ABNORMAL HIGH (ref 65–99)
Potassium: 4 mmol/L (ref 3.5–5.1)
SGOT(AST): 17 U/L (ref 15–37)
SGPT (ALT): 12 U/L (ref 12–78)
Sodium: 133 mmol/L — ABNORMAL LOW (ref 136–145)

## 2012-07-06 LAB — CBC CANCER CENTER
Basophil #: 0 x10 3/mm (ref 0.0–0.1)
Basophil %: 0.6 %
HCT: 29.3 % — ABNORMAL LOW (ref 35.0–47.0)
Lymphocyte #: 0.3 x10 3/mm — ABNORMAL LOW (ref 1.0–3.6)
Lymphocyte %: 6.1 %
MCV: 80 fL (ref 80–100)
Monocyte #: 0.2 x10 3/mm (ref 0.2–0.9)
Monocyte %: 4.5 %
Neutrophil #: 4.2 x10 3/mm (ref 1.4–6.5)
Neutrophil %: 88.7 %
Platelet: 200 x10 3/mm (ref 150–440)
RDW: 19.8 % — ABNORMAL HIGH (ref 11.5–14.5)
WBC: 4.7 x10 3/mm (ref 3.6–11.0)

## 2012-07-13 LAB — CBC CANCER CENTER
Basophil #: 0 x10 3/mm (ref 0.0–0.1)
Basophil %: 0.4 %
HCT: 28.2 % — ABNORMAL LOW (ref 35.0–47.0)
HGB: 9 g/dL — ABNORMAL LOW (ref 12.0–16.0)
Lymphocyte #: 0.4 x10 3/mm — ABNORMAL LOW (ref 1.0–3.6)
MCH: 25.6 pg — ABNORMAL LOW (ref 26.0–34.0)
MCHC: 32.1 g/dL (ref 32.0–36.0)
MCV: 80 fL (ref 80–100)
Monocyte %: 14.7 %
Neutrophil #: 4.6 x10 3/mm (ref 1.4–6.5)
Platelet: 239 x10 3/mm (ref 150–440)
RBC: 3.53 10*6/uL — ABNORMAL LOW (ref 3.80–5.20)
WBC: 5.9 x10 3/mm (ref 3.6–11.0)

## 2012-07-20 LAB — CBC CANCER CENTER
Basophil #: 0 x10 3/mm (ref 0.0–0.1)
Basophil %: 0.5 %
HCT: 25.3 % — ABNORMAL LOW (ref 35.0–47.0)
HGB: 8.1 g/dL — ABNORMAL LOW (ref 12.0–16.0)
Lymphocyte #: 0.6 x10 3/mm — ABNORMAL LOW (ref 1.0–3.6)
Lymphocyte %: 8.5 %
MCH: 25.6 pg — ABNORMAL LOW (ref 26.0–34.0)
MCHC: 32 g/dL (ref 32.0–36.0)
MCV: 80 fL (ref 80–100)
Monocyte #: 0.8 x10 3/mm (ref 0.2–0.9)
Monocyte %: 11.1 %
Neutrophil %: 79.8 %
WBC: 6.8 x10 3/mm (ref 3.6–11.0)

## 2012-07-20 LAB — COMPREHENSIVE METABOLIC PANEL
Albumin: 2.6 g/dL — ABNORMAL LOW (ref 3.4–5.0)
BUN: 15 mg/dL (ref 7–18)
Bilirubin,Total: 0.3 mg/dL (ref 0.2–1.0)
Calcium, Total: 8.7 mg/dL (ref 8.5–10.1)
Chloride: 99 mmol/L (ref 98–107)
Creatinine: 0.9 mg/dL (ref 0.60–1.30)
EGFR (African American): >60
Potassium: 4.1 mmol/L (ref 3.5–5.1)

## 2012-07-28 ENCOUNTER — Ambulatory Visit: Payer: Self-pay | Admitting: Hematology and Oncology

## 2012-08-04 LAB — COMPREHENSIVE METABOLIC PANEL
Albumin: 2.9 g/dL — ABNORMAL LOW (ref 3.4–5.0)
Alkaline Phosphatase: 91 U/L (ref 50–136)
BUN: 17 mg/dL (ref 7–18)
Calcium, Total: 8.3 mg/dL — ABNORMAL LOW (ref 8.5–10.1)
EGFR (Non-African Amer.): 53 — ABNORMAL LOW
Osmolality: 273 (ref 275–301)
Potassium: 4.1 mmol/L (ref 3.5–5.1)
SGOT(AST): 19 U/L (ref 15–37)
Sodium: 134 mmol/L — ABNORMAL LOW (ref 136–145)
Total Protein: 6.7 g/dL (ref 6.4–8.2)

## 2012-08-04 LAB — CBC CANCER CENTER
Basophil #: 0 x10 3/mm (ref 0.0–0.1)
Basophil %: 1.1 %
Eosinophil %: 0.1 %
HCT: 25.9 % — ABNORMAL LOW (ref 35.0–47.0)
HGB: 8.4 g/dL — ABNORMAL LOW (ref 12.0–16.0)
Lymphocyte #: 0.3 x10 3/mm — ABNORMAL LOW (ref 1.0–3.6)
Lymphocyte %: 13.5 %
MCHC: 32.6 g/dL (ref 32.0–36.0)
MCV: 80 fL (ref 80–100)
Monocyte #: 0.4 x10 3/mm (ref 0.2–0.9)
Monocyte %: 18.8 %
Neutrophil #: 1.5 x10 3/mm (ref 1.4–6.5)
RBC: 3.24 10*6/uL — ABNORMAL LOW (ref 3.80–5.20)

## 2012-08-17 LAB — COMPREHENSIVE METABOLIC PANEL
Albumin: 2.6 g/dL — ABNORMAL LOW (ref 3.4–5.0)
Alkaline Phosphatase: 105 U/L (ref 50–136)
BUN: 18 mg/dL (ref 7–18)
Bilirubin,Total: 0.1 mg/dL — ABNORMAL LOW (ref 0.2–1.0)
Chloride: 99 mmol/L (ref 98–107)
Co2: 23 mmol/L (ref 21–32)
Creatinine: 0.89 mg/dL (ref 0.60–1.30)
EGFR (Non-African Amer.): >60
Glucose: 182 mg/dL — ABNORMAL HIGH (ref 65–99)
Osmolality: 275 (ref 275–301)
Potassium: 4.2 mmol/L (ref 3.5–5.1)
SGOT(AST): 19 U/L (ref 15–37)
Sodium: 134 mmol/L — ABNORMAL LOW (ref 136–145)

## 2012-08-17 LAB — CBC CANCER CENTER
Basophil #: 0 x10 3/mm (ref 0.0–0.1)
Eosinophil %: 0.4 %
HGB: 8.2 g/dL — ABNORMAL LOW (ref 12.0–16.0)
MCH: 27.3 pg (ref 26.0–34.0)
MCHC: 33 g/dL (ref 32.0–36.0)
Monocyte %: 11.3 %
Neutrophil #: 5.3 x10 3/mm (ref 1.4–6.5)
Neutrophil %: 78.1 %
WBC: 6.8 x10 3/mm (ref 3.6–11.0)

## 2012-08-18 LAB — CANCER CENTER HEMOGLOBIN: HGB: 7.3 g/dL — ABNORMAL LOW (ref 12.0–16.0)

## 2012-08-24 LAB — CBC CANCER CENTER
Basophil #: 0 "x10 3/mm "
Basophil %: 0.2 %
Eosinophil #: 0 "x10 3/mm "
Eosinophil %: 0.2 %
HCT: 36.3 %
HGB: 12.1 g/dL
Lymphocyte %: 6.7 %
Lymphs Abs: 0.7 "x10 3/mm " — ABNORMAL LOW
MCH: 28.1 pg
MCHC: 33.2 g/dL
MCV: 85 fL
Monocyte #: 0.8 "x10 3/mm "
Monocyte %: 8.1 %
Neutrophil #: 8.4 "x10 3/mm " — ABNORMAL HIGH
Neutrophil %: 84.8 %
Platelet: 221 "x10 3/mm "
RBC: 4.3 "x10 6/mm "
RDW: 18.5 % — ABNORMAL HIGH
WBC: 9.9 "x10 3/mm "

## 2012-08-27 ENCOUNTER — Observation Stay: Payer: Self-pay | Admitting: Internal Medicine

## 2012-08-27 LAB — CBC WITH DIFFERENTIAL/PLATELET
Basophil #: 0 10*3/uL (ref 0.0–0.1)
Basophil %: 0.2 %
HCT: 32.9 % — ABNORMAL LOW (ref 35.0–47.0)
HGB: 11 g/dL — ABNORMAL LOW (ref 12.0–16.0)
Lymphocyte #: 0.5 10*3/uL — ABNORMAL LOW (ref 1.0–3.6)
Lymphocyte %: 5.4 %
MCH: 28.1 pg (ref 26.0–34.0)
MCHC: 33.5 g/dL (ref 32.0–36.0)
MCV: 84 fL (ref 80–100)
Neutrophil #: 8.5 10*3/uL — ABNORMAL HIGH (ref 1.4–6.5)
Platelet: 204 10*3/uL (ref 150–440)

## 2012-08-27 LAB — COMPREHENSIVE METABOLIC PANEL
Albumin: 2.7 g/dL — ABNORMAL LOW (ref 3.4–5.0)
BUN: 15 mg/dL (ref 7–18)
Calcium, Total: 8.3 mg/dL — ABNORMAL LOW (ref 8.5–10.1)
Chloride: 101 mmol/L (ref 98–107)
Co2: 30 mmol/L (ref 21–32)
EGFR (Non-African Amer.): >60
Glucose: 76 mg/dL (ref 65–99)
SGOT(AST): 31 U/L (ref 15–37)

## 2012-08-28 ENCOUNTER — Ambulatory Visit: Payer: Self-pay | Admitting: Hematology and Oncology

## 2012-08-29 ENCOUNTER — Ambulatory Visit: Payer: Self-pay | Admitting: Oncology

## 2012-09-27 ENCOUNTER — Ambulatory Visit: Payer: Self-pay | Admitting: Hematology and Oncology

## 2012-09-27 DEATH — deceased

## 2012-11-02 ENCOUNTER — Other Ambulatory Visit: Payer: Self-pay

## 2013-02-02 ENCOUNTER — Other Ambulatory Visit: Payer: Self-pay

## 2014-01-12 ENCOUNTER — Other Ambulatory Visit: Payer: Self-pay

## 2014-05-06 IMAGING — CT NM PET TUM IMG INITIAL (PI) SKULL BASE T - THIGH
1 of 5 series · 1 of 25 positions shown · non-contrast
Comparison: none

REASON FOR EXAM: lung cancer
COMMENTS:

PROCEDURE:     PET - PET/CT INIT STAGING LUNG CA  - December 07, 2011  [DATE]
RESULT:     History: Lung cancer.
Comparison Study: CT of the neck and chest 11/26/2011.

[Series 3: ct wb 3.0 b30f · axial · 3.0mm · 0.98mm/px · 1 of 435 slices shown]
[im 435/435  brain]
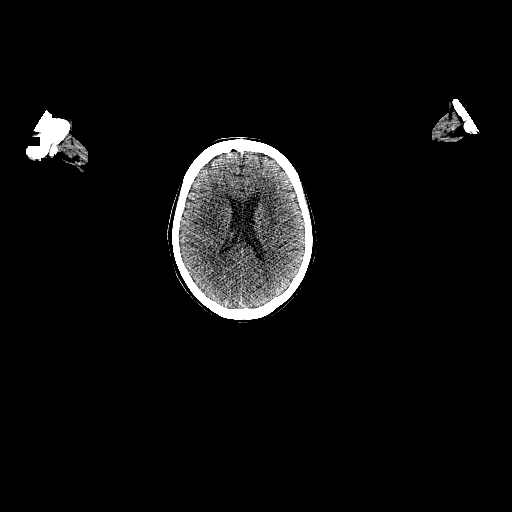

[1 of 25 positions shown; findings below may reference images not displayed]

FINDINGS: Standard PET obtained following administration 13.17 mCi of F-18
FDG. CT was obtained for attenuation correction and fusion.Extensive
supraclavicular, diffuse mediastinal, bilateral hilar intensely PET positive
adenopathy is noted with SUV levels up to 8. Right lower lobe infiltrate is
present. This is PET positive but may be secondary to pneumonia. A PET
positive lesion is noted in the right ischium. This is most likely
metastatic disease.
IMPRESSION: Intensely PET positive multiple supraclavicular,
mediastinal, and hilar lymph nodes consistent with metastatic disease. PET
positive right ischial bony lesion is present consistent with metastatic
disease.

## 2014-07-17 NOTE — Consult Note (Signed)
Reason for Visit: This 69 year old Female patient presents to the clinic for initial evaluation of  Calvarium metastases .   Referred by Dr. Doylene Canning.  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 She did have evidence of metastasis to the calvarium at the time of diagnosis. She was started SWOG protocolrandomized to the cetuximab  arm. She is received carboplatinum and Taxol and cetuximab. She's been having increasing pain in her scalp and recent repeat CT scan shows progressive disease in 2 areas of the calvariumwith some swelling the right side of the scalp. I been asked by Dr. Doylene Canning to evaluate the patient for possibility of palliative radiation therapy to her cranium. She is seen today and is doing fairly well. She is having no cough hemoptysis or chest tightness. She is only having some minor occasional headaches no change in neurologic status. She is quite sensitive to steroid therapypatient will be been base protocol failure and will be switch to other systemic chemotherapy under Dr. Aleda Grana direction. She seen today for consideration of palliative radiation therapy.  Past Hx:    Orthopnea:    Cancer Lung:    Wheezing:    Claustrophobic:    Oncology Protocol: S0819 Protocol: A Randomized, Phase 3 Study comparing Carboplatin/Paclitaxel or Carboplatin/Paclitaxel/Bevacizumab with or wirthout Concurrent Cetuximab in patients with Advanced  (NSCLC). Pt will receive Carboplatin,Paclitaxel, +/- Cetuximab.   anxiety:    Bronchitis:    Diabetes Mellitus, Type II (NIDD):     Hysterectomy - Total:    Breast biopsy: benign   back surgery:   Past, Family and Social History:   Past Medical History positive    Respiratory bronchitis; History of wheezing    Endocrine diabetes mellitus    Neurological/Psychiatric anxiety    Past Surgical History Back surgery, total hysterectomy, benign breast biopsy    Past Medical History Comments Leukopenia, claustrophobia    Family History positive    Family History Comments Mother with adult onset diabetes and cardiovascular heart disease    Social History positive    Social History Comments Greater than 40-pack-year smoking history quit smoking 2011    Additional Past Medical and Surgical History Accompanied by multiple family members today   Allergies:   Statins: Restless legs  Oxycodone: N/V/Diarrhea  Augmentin ES-600: N/V/Diarrhea  Codeine: Itching  Home Meds:  Home Medications: Medication Instructions Status  ondansetron 4 mg oral tablet 1 tab(s) orally 3 times a day, As Needed Active  tramadol 100 milligram(s) orally every 6 hours, As Needed- for Pain, # 30 with no refills. Active  mirtazapine 15 mg oral tablet 1 tab(s) orally once a day (at bedtime) Active  acetaminophen-hydrocodone 325 mg-5 mg oral tablet 1 tab(s) orally 4 times a day, As Needed Active  citalopram 20 mg oral tablet 1 tab(s) orally once a day (in the morning) Active  Nicoderm Apply topically to affected area once a day, As Needed. Removes at bedtime. Active  alprazolam 0.5 mg oral tablet 1 tab(s) orally 3 times a day, As Needed Active   Review of Systems:   General negative  Performance Status (ECOG) 0    Skin negative    Breast negative    Ophthalmologic negative    ENMT negative    Respiratory and Thorax see HPI    Cardiovascular see HPI    Gastrointestinal negative    Genitourinary negative    Musculoskeletal negative    Neurological negative    Psychiatric see HPI    Hematology/Lymphatics negative     Endocrine negative    Allergic/Immunologic negative    Review of Systems   according to nurse's notes Patient denies any weight loss, fatigue, weakness, fever, chills or night sweats. Patient denies any loss of vision, blurred vision. Patient denies any ringing  of the ears or hearing loss. No irregular heartbeat. Patient denies heart murmur or history of fainting. Patient denies any chest pain or pain radiating to her upper extremities. Patient denies any shortness of breath, difficulty breathing at night, cough or hemoptysis. Patient denies any swelling in the lower legs. Patient denies any nausea vomiting, vomiting of blood, or coffee ground material in the vomitus. Patient denies any stomach pain. Patient states has had normal bowel movements no significant constipation or diarrhea. Patient denies any dysuria, hematuria or significant nocturia. Patient denies any problems walking, swelling in the joints or loss of balance. Patient denies any skin changes, loss of hair or loss of weight. Patient denies any excessive worrying or anxiety or significant depression. Patient denies any problems with insomnia. Patient denies excessive thirst, polyuria, polydipsia. Patient denies any swollen glands, patient denies easy bruising or easy bleeding. Patient denies any recent infections, allergies or URI. Patient "s visual fields have not changed significantly in recent time.  Nursing Notes:  Nursing Vital Signs and Chemo Nursing Nursing Notes: *CC Vital Signs Flowsheet:   03-Dec-13 11:13   Temp Temperature 97.6   Pulse Pulse 98   Respirations Respirations 18   SBP SBP 110   DBP DBP 70   Pain Scale (0-10)  0   Height (cm) centimeters 157.6   Physical Exam:  General/Skin/HEENT:   General normal    Skin normal    Eyes normal    ENMT normal    Head and Neck normal    Additional PE Well-developed female in NAD. She has a significant area of calvarium involvement metastatic disease with almost  complete destruction of the calvarium. No evidence of skin ulceration is noted. Cranial nerves II through XII are grossly intact. Motor sensory and DTR levels are equal and symmetric in the upper or lower extremities. No motor or sensory levels are identified. Crude visual fields are within normal range. Fundi are benign on optic examination. 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  Plan:   the stomach to go it had without course of whole brain radiation therapy. We'll plan on delivering 3000 cGy over 2 weeks. Will try to start her on some low-dose Decadron to prevent brain from swelling during the course of radiation. Risks and benefits of treatment including change in cognitive function, loss of hair, scalp irritation, and alteration blood counts were all explained in detail to the patient and her family. I have set  her up for simulation later this week. Case was discussed with Dr. Doylene Canninghoksi. He will hold off any further systemic chemotherapy at this time till completion of radiation therapy.  I would like to take this opportunity to thank you for allowing me to continue to participate in this patient's care.  CC Referral:   cc: Dr. Ronna PolioJennifer Walker   Electronic Signatures: Rushie Chestnuthrystal, Gordy CouncilmanGlenn S (MD)  (Signed 03-Dec-13 15:45)  Authored: HPI, Diagnosis, Past Hx, PFSH, Allergies, Home Meds, ROS, Nursing Notes, Physical Exam, Encounter Assessment and Plan, CC Referring Physician   Last Updated: 03-Dec-13 15:45 by Rebeca Alerthrystal, Talulah Schirmer S (MD)

## 2014-07-17 NOTE — Op Note (Signed)
PATIENT NAME:  Regina RadarLLEN, Regina Kennedy MR#:  [id number] DATE OF BIRTH:  19-Apr-1945  DATE OF PROCEDURE:  01/05/2012  PREOPERATIVE DIAGNOSIS: Advanced lung cancer, need for central venous access.   POSTOPERATIVE DIAGNOSIS: Advanced lung cancer, need for central venous access.   OPERATIVE PROCEDURE: Left subclavian power port placement under ultrasound guidance.   OPERATING SURGEON: Donnalee CurryJeffrey Jere Vanburen, MD   ANESTHESIA: Attended local, 10 mL 1% plain Xylocaine.   ESTIMATED BLOOD LOSS: Minimal.   CLINICAL NOTE: This 69 year old woman has been identified with increasing pulmonary symptoms and recently was diagnosed with lung cancer. Central venous access has been requested by the treating oncologist. The patient received Kefzol prior to the procedure.   OPERATIVE NOTE: With the patient comfortably supine on the operating table, the area was prepped with ChloraPrep and draped. Ultrasound was used to confirm location and patency of the left subclavian vein. Under ultrasound guidance, the vein was cannulated followed by passage of a guidewire and dilator. The catheter was positioned at the right atrial/SVC junction. This was completed under fluoroscopy. The catheter was tunneled to a pocket on the left anterior chest. It was anchored in three points to minimize motion. The adipose tissue was closed with a running 3-0 Vicryl, and the skin closed with a running 4-0 Vicryl subcuticular suture. Benzoin, Steri-Strips, Telfa, and Tegaderm dressings were applied.   Erect portable chest x-ray in the recovery room showed no evidence of pneumothorax and the catheter tip as described above.   ____________________________ Earline MayotteJeffrey W. Precious Segall, MD jwb:bjt D:  01/05/2012 18:23:52 ET          T: 01/06/2012 07:43:08 ET        JOB#: [id number]  cc: Valarie ConesJanak Kennedy. Doylene Canninghoksi, MD Ginette PitmanJennifer A. Dan HumphreysWalker, MD Paislee Szatkowski Brion AlimentW Tanza Pellot MD ELECTRONICALLY SIGNED 01/11/2012 12:38

## 2014-07-20 NOTE — H&P (Signed)
PATIENT NAME:  Regina Kennedy, Regina Kennedy MR#:  [id number] DATE OF BIRTH:  [birth year]  DATE OF ADMISSION:  08/27/2012  The patient is a patient known to me and followed by Dr. 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 The patient recently on palliative radiation to increasing mass in the neck, chest and mediastinum. The patient has some history of underlying COPD. Recently seen in the cancer center. Recently seen for palliative radiation treatment. The patient is hospitalized now for progressive weakness, decreased p.o. intake with choking sensations and inability to swallow and intermittent respiratory distress and panic, coughing, had decreased p.o. intake yesterday and today, increased weakness, has become immobile, husband had to carry her to the bathroom to lift and carry her in and out of the bed. She does not have to strength to suck on an inhaler. The cough is nonproductive. No fever. No headache. No dizziness. Denies palpitations or retrosternal chest pain. Has had some neck and throat pain, but it is well controlled on current medications. No pain now. No abdominal pain, nausea, vomiting, diarrhea, though 2 days ago had a bout of loose stools. Was controlled with p.r.n. Imodium. No dysuria or hematuria. No increased extremity edema. No other bone pain. No gross focal weakness, but generalized weakness. No rash. No bruising.   ALLERGIES:  AUGMENTIN THAT CAUSED GI UPSET AND DIARRHEA.  FAMILY HISTORY:  Noncontributory.   SOCIAL HISTORY:  No alcohol. A prior smoker.   MEDICATIONS:   1.  Oxygen 2 liters, 2 to 4 p.r.n.  2.  Zofran 4 mg q.8h. p.r.n.  3.  Xanax 0.5 mg q.6h. p.r.n.  4.  Citalopram 20 mg p.o. daily.  5.  Reglan 10 mg p.o. t.i.d.  6.  Prednisone 10 mg p.o. daily.  7.  Duragesic 12 mcg patch.  8.  Oxycodone 10 mg q.6h. p.r.n.  9.  Advair inhaler p.r.n.,  has been unable to use.   PHYSICAL EXAMINATION: GENERAL:  Alert, cooperative, sluggish, oriented. No acute distress. In the wheelchair moving all extremities. General weakness and lower extremity weakness, but no gross focal weakness. NECK:  There were matted lymph node masses in the neck, right greater than left, not tender. HEENT:  There was no thrush.  LUNGS:  Have slight decreased air entry with bilateral rhonchi, but no wheezing, no rales.  HEART:  Regular.  ABDOMEN:  Nontender. No palpable mass or organomegaly.  EXTREMITIES:  Symmetric and some dependent ankle pretibial edema.  SKIN:  No rash or bruising.   LABORATORIES:  Not yet been obtained.   IMPRESSION AND PLAN:  The patient with increasing weakness, failure to thrive, decreased p.o. intake, intermittent respiratory distress, choking, panic attacks from increasing chest and mediastinal mass.   PLAN:  I have admitted into the hospital to manage symptoms. Will continue same medications. Can increase the Xanax to q.4h. p.r.n., this seems to have been the most effective thing this morning at home. We will access the port. We will check electrolytes. Can give IV normal saline. Will replace electrolytes on a p.r.n. basis. Would boost the prednisone. We will use SVN with DuoNeb. I have also discussed home nursing and hospice with patient and husband and they are receptive, so will make that referral.     ____________________________ Knute Neuobert G. Lorre NickGittin, MD rgg:nts D: 08/27/2012 10:58:48 ET T: 08/27/2012 11:28:06 ET JOB#: [id number]  cc: Knute Neuobert G. Lorre NickGittin, MD, <Dictator> Marin RobertsOBERT G GITTIN MD  ELECTRONICALLY SIGNED 09/16/2012 13:56

## 2014-07-20 NOTE — H&P (Signed)
PATIENT NAME:  Regina Kennedy, Regina Kennedy MR#:  [id number] DATE OF BIRTH:  08-30-1945  DATE OF ADMISSION:  05/23/2012  PRIMARY CARE PHYSICIAN:  Ronna Polio, MD  PRIMARY ONCOLOGIST:  Johney Maine, MD  CHIEF COMPLAINT: Generalized weakness.   HISTORY OF PRESENT ILLNESS: 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  The patient has been having cough since August 2013 that is what prompted to have evaluation and was found to have lung CA.  The patient states ever since has been having cough, on and off, turning sputum to mildly green/yellow color.  However, for the last 2 days has been experiencing severe generalized weakness, decreased energy level, decreased appetite and feeling thirsty. At baseline the patient walks with the help of a walker; however, in the last 2 days unable to walk. Denies having any fever. Denies having any shortness of breath more than baseline.  At baseline the patient does not use any oxygen.  On workup in the Emergency Department, the patient's initial oxygen saturations were 89% on room air.  The patient was initiated on oxygen, 2 liters through nasal cannula, improved oxygen saturations to 96%.  Chest x-ray done in the Emergency Department revealed questionable infiltrates in the left lower lobe.  Also had CT of the head without contrast that showed moderate enlargement of the ventricular systems, also compatible with cerebral atrophy, and there was lytic lesion within the right frontoparietal bone.  Per husband as well as per patient, no increased headache or loss of consciousness. The patient's husband also noted that the patient has been more confused.  This evening the patient was found to be somewhat disoriented. Consequently this  patient was brought to the Emergency Department and was found to have the above findings of hypoxia and questionable left lower infiltrate. The patient is also found to have mildly elevated WBC count of 14,000.  The patient's baseline hemoglobin stayed around 9.  However, the patient's hemoglobin trended down to 8.  The patient received one dose of Levaquin in the Emergency Department.    PAST MEDICAL HISTORY:  1.  Metastatic lung CA. 2.  COPD. 3.  Anxiety. 4.  Diabetes mellitus, diet-controlled.  PAST SURGICAL HISTORY:  1.  Hysterectomy. 2.  Breast biopsy, benign. 3.  Back surgery.  DRUG ALLERGIES:  1.  CODEINE. 2.  AUGMENTIN. 3.  NYSTATIN.  HOME MEDICATIONS: 1.  Silvadene 1% topical cream to the effected area. 2.  Tramadol 15 mg 1 tablet daily. 3.  Promethazine 25 mg every 8 hours as needed. 4.  Oxycodone 10 mg every 6 hours as needed. 5.  Zofran 8 mg 3 times a day as needed. 6.  Metoclopramide 10 mg 1 tablet 2 times a day. 7.  Duragesic patch 12 mcg every 72 hours. 8.  Citalopram 20 mg daily. 9.  Ultram 0.5 mg 3 times daily as needed. 10. Acetaminophen 1 tablet every 6 hours.  SOCIAL HISTORY:  History of smoking for 30 pack-year history. Quit in 2011. Denies drinking alcohol or using illicit drugs. Married and lives with her husband who is present at bedside.   FAMILY HISTORY:  Diabetes mellitus and heart disease in mother.  REVIEW OF SYSTEMS:  CONSTITUTIONAL:  Has generalized weakness, fatigue. EYES: No blurred vision, redness. ENT: Denies any sore  throat, however, has been experiencing somewhat decreased hearing. RESPIRATORY: Has cough with mild productive sputum. Has wheezing. CARDIOVASCULAR:  Denies any chest pain, palpitations, PND or orthopnea. GASTROINTESTINAL:  Decreased appetite. No nausea or vomiting or diarrhea. GENITOURINARY:  Denies dysuria, hematuria. ENDOCRINE:  No polyuria, polydipsia, increased sweating, cold and heat intolerance. HEME:  Denies any easy  bruising or bleeding. MUSCULOSKELETAL: Has generalized body aches. NEUROLOGIC:  Denies any numbness or weakness of any part of the body. PSYCH:  Has history of anxiety.  PHYSICAL EXAMINATION: GENERAL: This is a well-built, well-nourished, age-appropriate female lying down in the bed, not in distress. VITAL SIGNS:  Temperature 98.3, pulse 83, blood pressure 131/61, respiratory rate 16, oxygen saturations 93% on 2 liters of oxygen. HEENT:  Head normocephalic, atraumatic. No scleral icterus. Conjunctivae normal. Pupils equal and reactive to light. Mucous membranes moist.  NECK:  Supple. Has a large lymph node on the right side of the neck. CHEST:  No focal tenderness. Wheezing on the left side of the lung more than the right. However has bilaterally coarse breath sounds. Mild prolonged expiratory phase.  HEART:  S1 and S2 regular. No murmurs or heard. ABDOMEN: Bowel sounds present. Soft. Nontender. Nondistended. EXTREMITIES:  No pedal edema. Pulses 2+.  NEUROLOGIC: The patient is alert and oriented to place, person and time. Cranial nerves II through XII intact. No motor or sensory deficits.   LABS: CBC:  WBC of 14,000, hemoglobin 8, platelet count of 264.   BMP:  BUN 18, creatinine of 0.8. The rest of the values are well within normal limits. The cardiac enzymes are one set within normal limits.   DIAGNOSTICS: EKG, 12 lead, shows normal sinus rhythm with no ST-T wave abnormalities.   CT of head without contrast showed moderate enlargement of the ventricular system, of the sulci, compatible with cerebral atrophy. Periventricular white matter lesions compatible with chronic small vessel ischemic changes. Large lytic lesion within the right frontoparietal bone measuring up to 3.9 cm in diameter and extending through the inner and outer table.  Increased calcification is seen within this lesion compared to prior exam.  Decreased soft tissue component within the adjacent scalp.  Increased diameter  since prior exam. Other lytic lesions in the parietal lobe.    Chest x-ray commented as right frontoparietal and left parietal bone metastatic lesions appear mildly increased in size compared to the prior exam.   Chest x-ray per Emergency Department physician has heterogenous appearance in the left lower lobe.   ASSESSMENT AND PLAN: Ms. Freida Busmanllen is a 69 year old unfortunate female with metastatic lung cancer with extensive metastasis who comes in with severe generalized weakness, cough and  hypoxia. 1.  Pneumonia.  The patient has not been admitted to the hospital, however, has been receiving chemotherapy. Is not febrile, however, has elevated white blood cell count. We will start the patient on Levaquin. The patient states no increased productive sputum compared to the baseline. If the patient continues to have worsening of the sputum production or begins to have temperature then considering adding vancomycin. Blood cultures and sputum cultures were obtained in the Emergency Department.  2.  Chronic obstructive pulmonary disease exacerbation. We will continue breathing treatments. The patient declines for any steroids which caused her to have anxiety and somewhat psychosis.  Discussed with the patient starting on low dose steroids; however, the patient declined for this at this time.  3.  Hypoxia. This could be combination of chronic obstructive pulmonary disease exacerbation and possible underlying pneumonia. Continue with current  treatment and followup.  4.  Anemia.  The patient's hemoglobin mildly trended down from the baseline, from 9 to 8.  No current indication for transfusion. We will continue to watch.  5.  Diabetes mellitus, diet-controlled.  6.  Generalized weakness.  We will involve physical therapy.  7.  Metastatic lung cancer to the skull and cranium. The patient underwent whole brain radiation.  Also has metastasis in the right hip area. We will consider consulting oncologist, Dr. Doylene Canning.   Last chemotherapy was May 03, 2012. This is 3 weeks from the chemotherapy. This is unlikely related to chemotherapy.  8.  Keep the patient on DVT prophylaxis with Lovenox.  ____________________________ Susa Griffins, MD pv:sb D: 05/23/2012 02:12:35 ET T: 05/23/2012 08:12:24 ET JOB#: [id number]  cc: Susa Griffins, MD, <Dictator> Clerance Lav Rosaland Shiffman MD ELECTRONICALLY SIGNED 06/03/2012 1:26

## 2014-07-20 NOTE — Consult Note (Signed)
History of Present Illness:  Reason for Consult Stage IV lung cancer, now with increasing shortness of breath and pneumonia.   HPI   Patient is a 69 year old female with known stage IV lung cancer who last received chemotherapy between February 5 and 7th with cisplatin and etoposide.  Patient has had a chronic cough for several months, but noticed increasing shortness of breath over the last several days.  Upon arrival to the emergency room, a chest x-ray was performed and there was concern of a  developing pneumonia.  Currently, the patient othrwise feels well.  She complains of her "ears stopped up", but this is also chronic in nature.  She continues to have pelvic pain at the site of a known metastatic deposit, but denies any other pain.  She has no neurologic complaints.  She denies any fevers.  She has a fair appetite, but denies any nausea, vomiting, constipation, or diarrhea.  She has no urinary complaints.  Patient otherwise feels well and offers no further specific complaints.   PFSH:  Family History positive, mother had diabetes and heart disease   Social History negative alcohol, positive tobacco, quit smoking 07/2009   Additional Past Medical and Surgical History No significant past medical history   Review of Systems:  Performance Status (ECOG) 2   Review of Systems   As per HPI. Otherwise, 10 point system review was negative.   NURSING NOTES: **Vital Signs.:   24-Feb-14 14:19   Vital Signs Type: Routine   Temperature Temperature (F): 98.1   Celsius: 36.7   Temperature Source: oral   Pulse Pulse: 85   Respirations Respirations: 18   Systolic BP Systolic BP: 97   Diastolic BP (mmHg) Diastolic BP (mmHg): 60   Mean BP: 72   Pulse Ox % Pulse Ox %: 90   Pulse Ox Activity Level: At rest   Oxygen Delivery: 2L; Nasal Cannula   Physical Exam:  Physical Exam General: Well-developed, well-nourished, no acute distress. Eyes: Pink conjunctiva, anicteric  sclera. HEENT: Normocephalic, moist mucous membranes, clear oropharnyx. Lungs: scattered wheezing. Heart: Regular rate and rhythm. No rubs, murmurs, or gallops. Abdomen: Soft, nontender, nondistended. No organomegaly noted, normoactive bowel sounds. Musculoskeletal: No edema, cyanosis, or clubbing. Neuro: Alert, answering all questions appropriately. Cranial nerves grossly intact. Skin: No rashes or petechiae noted. Psych: Normal affect.    Statins: Restless legs  Augmentin ES-600: N/V/Diarrhea  Codeine: Itching    Remeron 15 mg oral tablet: 1 tab(s) orally once a day (at bedtime), Status: Active, Quantity: 30, Refills: 3   metoclopramide 10 mg oral tablet: 1 tab(s) orally 2 times a day, Status: Active, Quantity: 60, Refills: 3   citalopram 20 mg oral tablet: 1 tab(s) orally once a day (in the morning), Status: Active, Quantity: 30, Refills: None   ondansetron 8 mg oral tablet: 1 tab(s) orally 3 times a day as needed for nausea., Status: Active, Quantity: 90, Refills: 2   alprazolam 0.5 mg oral tablet: 1 tab(s) orally 3 times a day, As Needed- for Anxiety, Nervousness , Status: Active, Quantity: 0, Refills: None   Duragesic-12 12 mcg/hr transdermal film, extended release: 1 PATCH transdermal every 72 hours, Status: Active, Quantity: 10, Refills: None   oxyCODONE 10 mg oral tablet: 1 tab(s) orally every 6 hours, As Needed- for Pain , Status: Active, Quantity: 60, Refills: None  Laboratory Results: Hepatic:  23-Feb-14 22:38   Bilirubin, Total 0.4  Alkaline Phosphatase 131  SGPT (ALT)  11  SGOT (AST) 22  Total Protein, Serum  7.0  Albumin, Serum  2.9  Routine Chem:  23-Feb-14 22:38   Glucose, Serum  117  BUN 18  Creatinine (comp) 0.81  Sodium, Serum  132  Potassium, Serum 4.5  Chloride, Serum 98  CO2, Serum 25  Calcium (Total), Serum  8.2  Osmolality (calc) 267  eGFR (African American) >60  eGFR (Non-African American) >60 (eGFR values <17mL/min/1.73 m2 may be an  indication of chronic kidney disease (CKD). Calculated eGFR is useful in patients with stable renal function. The eGFR calculation will not be reliable in acutely ill patients when serum creatinine is changing rapidly. It is not useful in  patients on dialysis. The eGFR calculation may not be applicable to patients at the low and high extremes of body sizes, pregnant women, and vegetarians.)  Anion Gap 9  Cardiac:  23-Feb-14 22:38   CK, Total 25  CPK-MB, Serum  < 0.5 (Result(s) reported on 22 May 2012 at 11:06PM.)  Troponin I < 0.02 (0.00-0.05 0.05 ng/mL or less: NEGATIVE  Repeat testing in 3-6 hrs  if clinically indicated. >0.05 ng/mL: POTENTIAL  MYOCARDIAL INJURY. Repeat  testing in 3-6 hrs if  clinically indicated. NOTE: An increase or decrease  of 30% or more on serial  testing suggests a  clinically important change)  Routine Hem:  23-Feb-14 22:38   WBC (CBC)  14.1  RBC (CBC)  3.34  Hemoglobin (CBC)  8.0  Hematocrit (CBC)  25.9  Platelet Count (CBC) 264  MCV  78  MCH  23.9  MCHC  30.7  RDW  24.0  Neutrophil % 87.0  Lymphocyte % 4.4  Monocyte % 8.1  Eosinophil % 0.2  Basophil % 0.3  Neutrophil #  12.3  Lymphocyte #  0.6  Monocyte #  1.1  Eosinophil # 0.0  Basophil # 0.0 (Result(s) reported on 22 May 2012 at 11:20PM.)  Segmented Neutrophils 89  Lymphocytes 6  Monocytes 5  Diff Comment 1 ANISOCYTOSIS  Diff Comment 2 ELLIPTOCYTES  Diff Comment 3 NORMAL PLT MORPHOLGY  Result(s) reported on 22 May 2012 at 11:20PM.  Manual Diff MANUAL DIFF DONE  Result(s) reported on 22 May 2012 at 11:21PM.   Assessment and Plan: Impression:   Stage IV lung cancer, pneumonia. Plan:   1.  Lung cancer: Patient last chemotherapy was between February 5th and 7th with cisplatin and etoposide.  She also received Neulasta on February 8.  She has been instructed to keep her previously scheduled followup appointment on May 31, 2012 for further evaluation and consideration of her next  treatment.  Given her pneumonia, this will likely be delayed 1-2 weeks.  Pneumonia: Agree with current antibiotics. Anemia: Secondary chemotherapy, monitor. Transfuse if her hemoglobin falls below 7.0. Pain: Patient has completed her palliative XRT.  Continue current narcoegimen. Leukocytosis: Possibly related to pneumonia, the patient did receive Neulasta as above. consult, will follow  Electronic Signatures: Delight Hoh (MD)  (Signed 24-Feb-14 15:49)  Authored: HISTORY OF PRESENT ILLNESS, PFSH, ROS, NURSING NOTES, PE, ALLERGIES, HOME MEDICATIONS, LABS, ASSESSMENT AND PLAN   Last Updated: 24-Feb-14 15:49 by Delight Hoh (MD)

## 2014-07-20 NOTE — Discharge Summary (Signed)
PATIENT NAME:  Regina Kennedy, Regina Kennedy MR#:  [id number] DATE OF BIRTH:  07-14-1945  DATE OF ADMISSION:  05/23/2012 DATE OF DISCHARGE:  05/25/2012  ADMITTING PHYSICIAN: Susa Griffins, MD  DISCHARGING PHYSICIAN: Enid Baas, MD  PRIMARY ONCOLOGIST: Johney Maine, MD  PRIMARY CARE PHYSICIAN: Ronna Polio, MD   HOSPITAL CONSULTATIONS: Oncology consultation by Dr. Orlie Dakin.   DISCHARGE DIAGNOSES:  1. Systemic inflammatory response syndrome.  2. Right lower lobe pneumonia.  3. Acute respiratory failure secondary to chronic obstructive pulmonary disease exacerbation and pneumonia.  4. Anemia, likely from chemotherapy.  5. Diabetes mellitus.  6. Metastatic lung cancer with metastases to skull and skull cranium and also right hip area, status post radiation.  7. Anxiety.   DISCHARGE HOME MEDICATIONS:  1. Xanax 0.5 mg 3 times a day as needed.  2. Zofran 8 mg 3 times a day as needed for nausea.  3. Duragesic Patch 12 mcg every 72 hours transdermal.  4. Celexa 20 mg p.o. daily.  5. Remeron 15 mg p.o. at bedtime.  6. Oxycodone 10 mg every 6 hours as needed for pain.  7. Advair 250/50, 1 puff b.i.d.  8. Combivent Respimat 1 puff 4 times a day.  9. Levaquin 750 mg p.o. daily for 4 more days.   DISCHARGE OXYGEN: 2 liters.   DISCHARGE DIET: Low-sodium diet.  ACTIVITY: As tolerated.   FOLLOW-UP INSTRUCTIONS: 1. Follow up with Dr. Doylene Canning at the [facility] as prior scheduled for next week.  2. CBC followup in 3 days.  3. Home Health physical therapy and nursing.   LABORATORY, DIAGNOSTIC AND RADIOLOGICAL DATA:  WBC 10.4, hemoglobin 10.0, hematocrit 21.8, platelet count 254.  Sodium 136, potassium 3.9, chloride 106, bicarbonate 24, BUN 9, creatinine 0.78, glucose 85, and calcium of 7.2.  Urinalysis negative for infection. Urine culture is negative.   CT of the chest is showing severe mediastinal and hilar lymphadenopathy with compression of  bronchus intermedius and dense infiltrate on  the right lung base. CT of the head showing chronic white matter disease and enlarging calvarial lesions consistent with metastatic disease. No significant mass effect on brain parenchyma seen. Blood cultures are negative.   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 She was also found to be hypoxic, sats 88% on room air. CT of the chest did show a right lower lobe infiltrate.   1. Acute respiratory failure secondary to chronic obstructive pulmonary disease exacerbation and also right lower lobe pneumonia: Pneumonia appears more postobstructive from her enlarged hilar lymphadenopathy. She had cough, white count, tachycardia on initial admission, improved with IV antibiotics. She is currently being discharged on Levaquin. She is also requiring 2 liters oxygen during this hospital course and is being discharged home on oxygen. Her COPD improved. She still has occasional wheeze on exam. She is being discharged on Advair and also Combivent Respimat; however, the patient refuses to use any inhalers as she had tried inhalers in the past, and she did not like them.  2. Acute on chronic anemia: Likely secondary to chemotherapy. Hemoglobin has remained stable.  Lowest was as low as 7.0.  She has a follow-up appointment with Dr. Doylene Canning for next week, so I advised  to get blood work done  prior to that and might need transfusion if hemoglobin drops or she becomes symptomatic. She has not been tachycardic or hypotensive while she was in the hospital.  3. Stage IV squamous cell carcinoma of the lung, currently on chemotherapy. She finished radiation for her skull and also hip lesions.  She will finish up her cycles of  chemotherapy next week and is scheduled for an outpatient PET scan for follow-up.  4. Depression and anxiety: On Remeron and also Xanax p.r.n.   CODE STATUS:  FULL CODE.    Her course has been otherwise uneventful in the hospital.   DISCHARGE CONDITION: Stable.   DISCHARGE DISPOSITION: Home with Home Health.   TIME SPENT ON DISCHARGE: 45 minutes.   ____________________________ Enid Baasadhika Shella Lahman, MD [id number] D: 05/25/2012 16:03:25 ET T: 05/25/2012 16:18:36 ET JOB#: [id number]  cc: Enid Baasadhika Tashonda Pinkus, MD, <Dictator> Janak K. Doylene Canninghoksi, MD Enid BaasADHIKA Deyra Perdomo MD ELECTRONICALLY SIGNED 06/01/2012 13:37

## 2014-09-24 ENCOUNTER — Other Ambulatory Visit: Payer: Self-pay
